# Patient Record
Sex: Male | Born: 1985 | Race: Black or African American | Hispanic: No | Marital: Single | Smoking: Current every day smoker
Health system: Southern US, Community
[De-identification: ages and names within clinical notes are randomized; demographics above are authoritative.]

---
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
## 2007-04-23 ENCOUNTER — Emergency Department (HOSPITAL_COMMUNITY): Admission: EM | Admit: 2007-04-23 | Discharge: 2007-04-23 | Payer: Self-pay | Admitting: Emergency Medicine

## 2007-05-27 ENCOUNTER — Emergency Department (HOSPITAL_COMMUNITY): Admission: EM | Admit: 2007-05-27 | Discharge: 2007-05-27 | Payer: Self-pay | Admitting: Emergency Medicine

## 2007-07-30 ENCOUNTER — Emergency Department (HOSPITAL_COMMUNITY): Admission: EM | Admit: 2007-07-30 | Discharge: 2007-07-30 | Payer: Self-pay | Admitting: Emergency Medicine

## 2007-08-01 ENCOUNTER — Emergency Department (HOSPITAL_COMMUNITY): Admission: EM | Admit: 2007-08-01 | Discharge: 2007-08-01 | Payer: Self-pay | Admitting: Emergency Medicine

## 2007-08-30 ENCOUNTER — Emergency Department (HOSPITAL_COMMUNITY): Admission: EM | Admit: 2007-08-30 | Discharge: 2007-08-30 | Payer: Self-pay | Admitting: Emergency Medicine

## 2007-08-31 ENCOUNTER — Ambulatory Visit (HOSPITAL_COMMUNITY): Admission: RE | Admit: 2007-08-31 | Discharge: 2007-08-31 | Payer: Self-pay | Admitting: Emergency Medicine

## 2008-02-01 ENCOUNTER — Emergency Department (HOSPITAL_BASED_OUTPATIENT_CLINIC_OR_DEPARTMENT_OTHER): Admission: EM | Admit: 2008-02-01 | Discharge: 2008-02-01 | Payer: Self-pay | Admitting: Emergency Medicine

## 2008-05-08 ENCOUNTER — Ambulatory Visit (HOSPITAL_COMMUNITY): Admission: EM | Admit: 2008-05-08 | Discharge: 2008-05-08 | Payer: Self-pay | Admitting: Emergency Medicine

## 2008-09-17 ENCOUNTER — Emergency Department (HOSPITAL_BASED_OUTPATIENT_CLINIC_OR_DEPARTMENT_OTHER): Admission: EM | Admit: 2008-09-17 | Discharge: 2008-09-17 | Payer: Self-pay | Admitting: Emergency Medicine

## 2008-11-05 ENCOUNTER — Emergency Department (HOSPITAL_COMMUNITY): Admission: EM | Admit: 2008-11-05 | Discharge: 2008-11-05 | Payer: Self-pay | Admitting: Emergency Medicine

## 2008-11-11 IMAGING — US US ABDOMEN COMPLETE
1 series · 14 of 25 positions shown · non-contrast
Comparison: None available

CLINICAL DATA: Abdominal pain

ABDOMEN ULTRASOUND
TECHNIQUE: Complete abdominal ultrasound examination was performed
including evaluation of the liver, gallbladder, bile ducts,
pancreas, kidneys, spleen, IVC, and abdominal aorta.

[Series 1: unknown · 0.33mm/px · 14 of 57 slices shown]
[im 1/57]
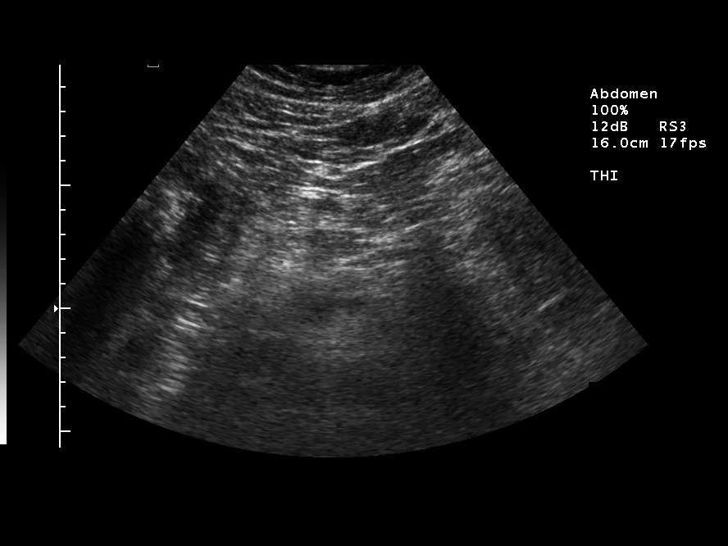
[im 5/57]
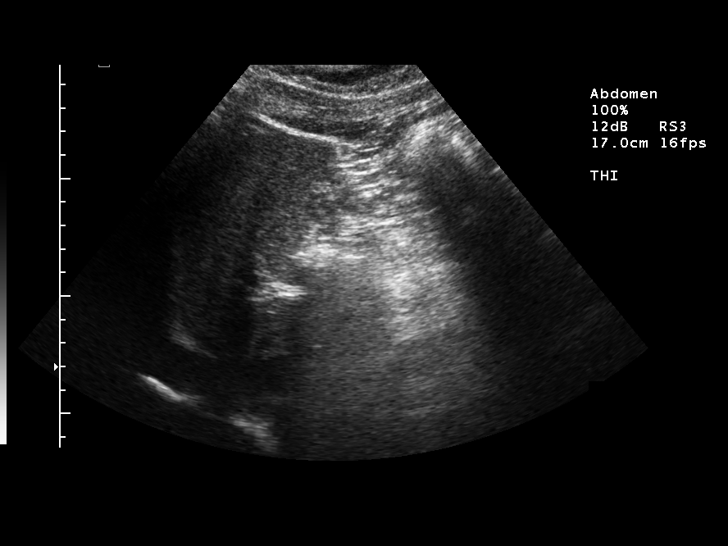
[im 10/57]
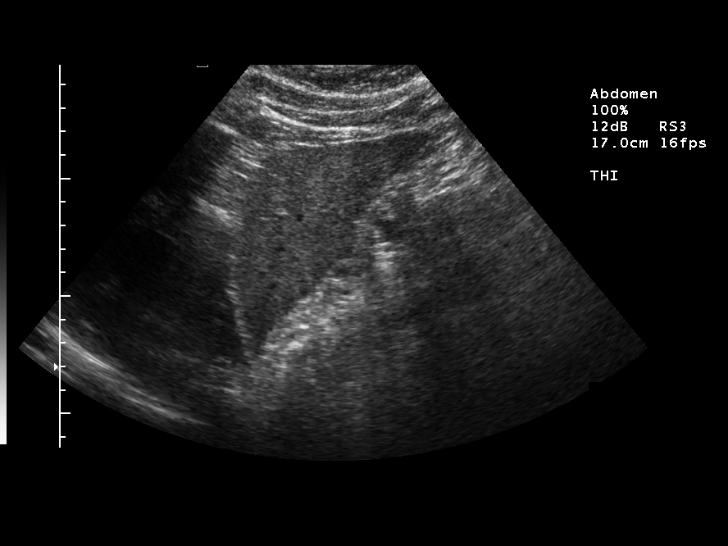
[im 15/57]
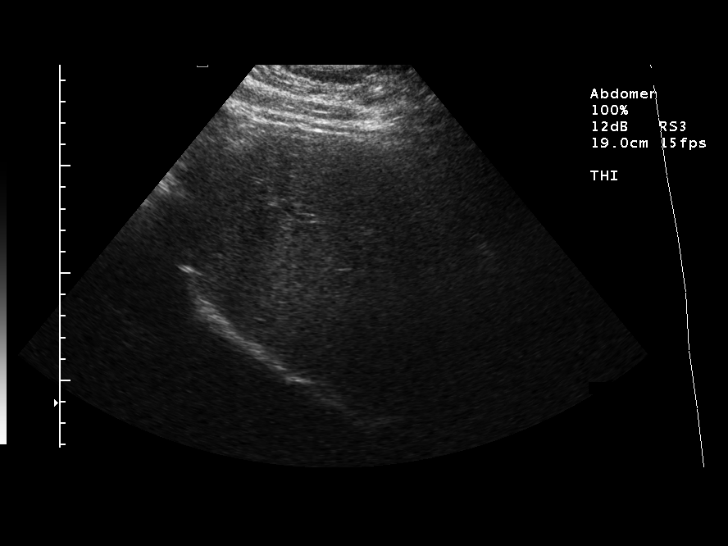
[im 19/57]
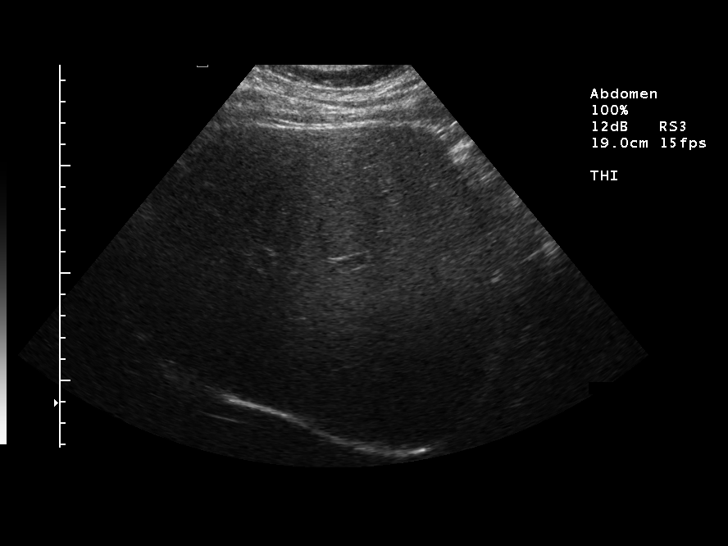
[im 22/57]
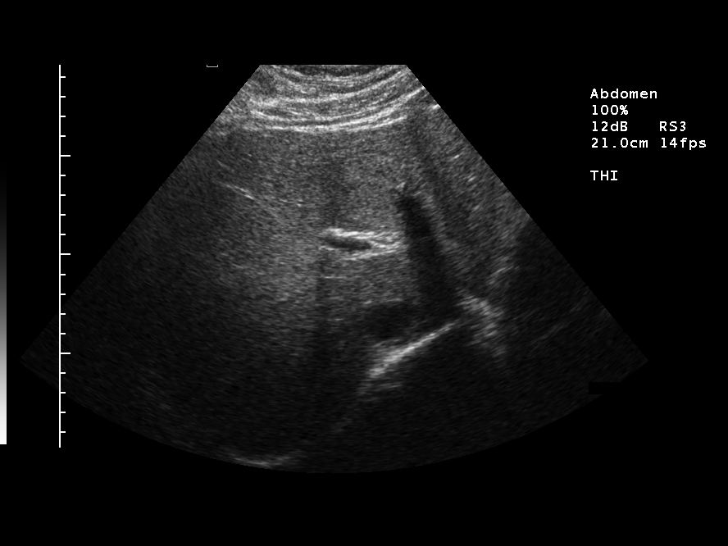
[im 26/57]
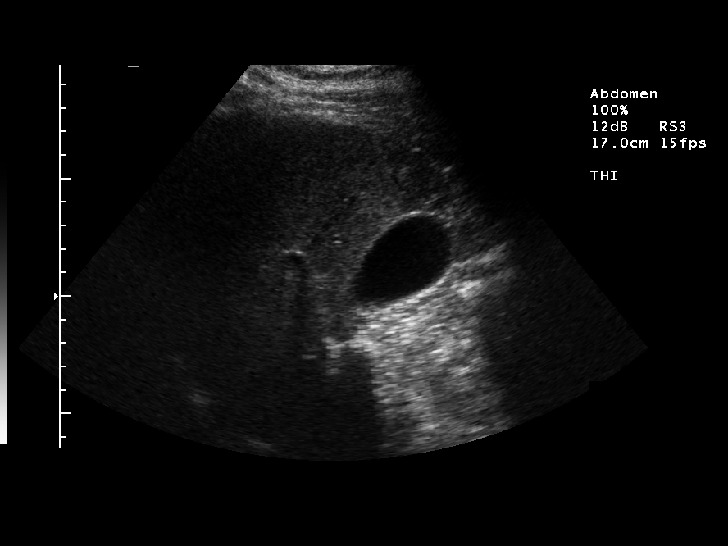
[im 31/57]
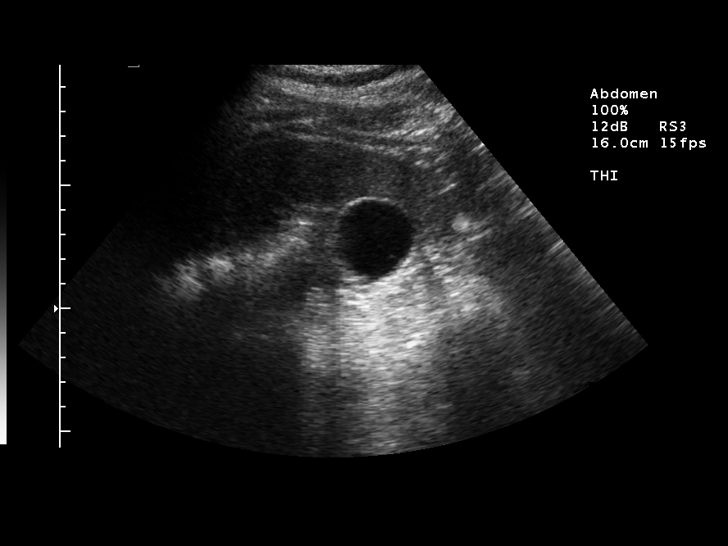
[im 36/57]
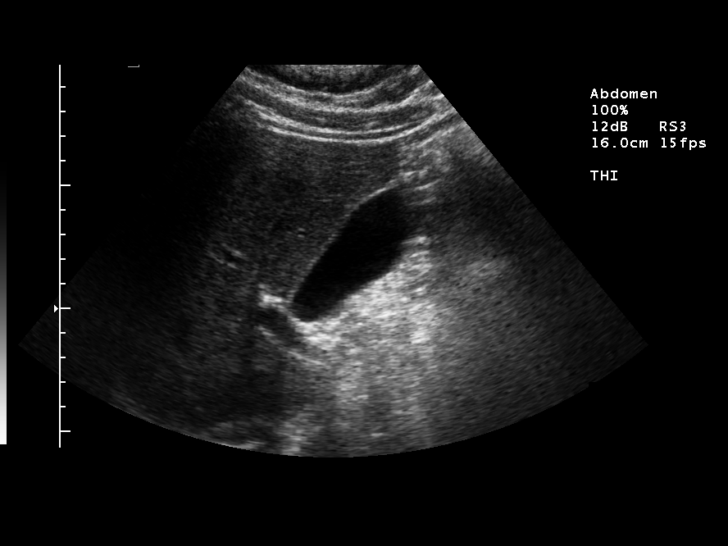
[im 38/57]
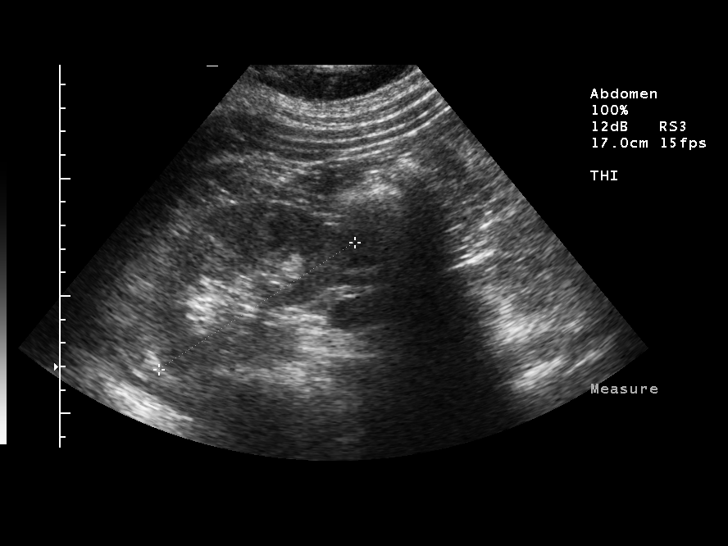
[im 43/57]
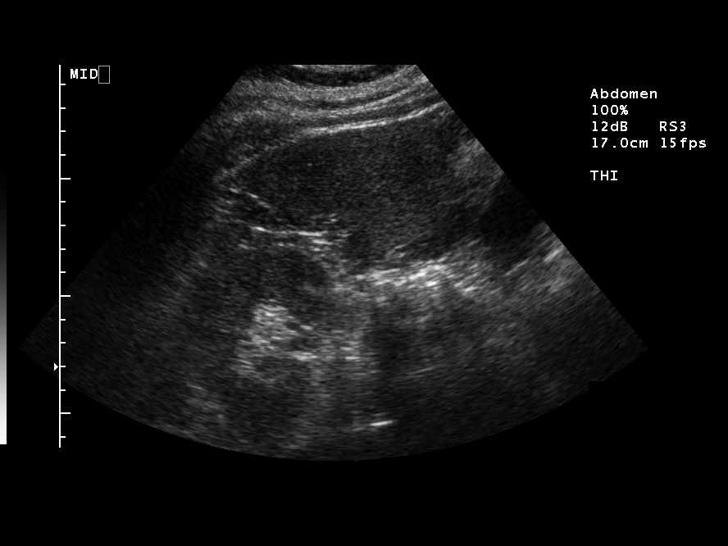
[im 47/57]
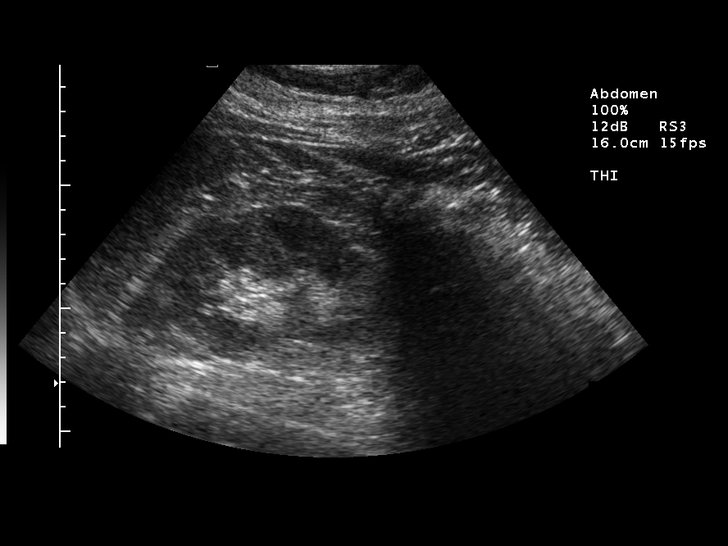
[im 52/57]
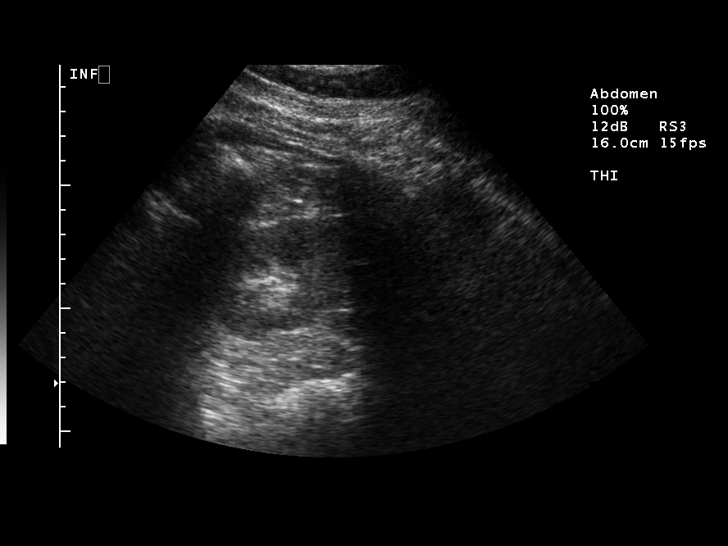
[im 57/57]
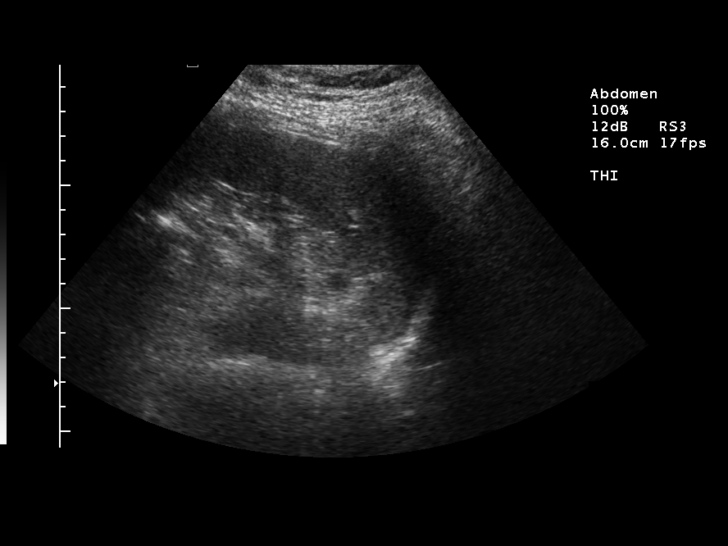

[14 of 25 positions shown; findings below may reference images not displayed]

FINDINGS: Gallbladder:  No gallstones.  No gallbladder wall thickening or
pericholecystic fluid. Negative sonographic Murphy's sign per the
ultrasound technologist.

Common bile duct: Normal in caliber.

Liver:  Normal size and echotexture.  No focal parenchymal
abnormalities.

Inferior vena cava:  Patent.

Pancreas:  Visualized portions unremarkable.

Spleen:  Normal size and echotexture without focal parenchymal
abnormalities.

Right kidney:  No hydronephrosis.   Normal parenchymal echotexture
without focal abnormalities.

Left kidney:  No hydronephrosis. Normal parenchymal echotexture
without focal abnormalities.

Abdominal aorta:  Visualized portions normal in caliber,
unremarkable..
IMPRESSION: Normal abdominal ultrasound.

## 2009-02-13 ENCOUNTER — Emergency Department (HOSPITAL_COMMUNITY): Admission: EM | Admit: 2009-02-13 | Discharge: 2009-02-13 | Payer: Self-pay | Admitting: Emergency Medicine

## 2009-04-17 ENCOUNTER — Emergency Department: Payer: Self-pay | Admitting: Emergency Medicine

## 2009-04-29 ENCOUNTER — Emergency Department: Payer: Self-pay | Admitting: Unknown Physician Specialty

## 2010-04-03 LAB — CBC
MCHC: 34.1 g/dL (ref 30.0–36.0)
MCV: 93 fL (ref 78.0–100.0)
RDW: 14.4 % (ref 11.5–15.5)
WBC: 9.3 10*3/uL (ref 4.0–10.5)

## 2010-04-03 LAB — DIFFERENTIAL
Basophils Absolute: 0 10*3/uL (ref 0.0–0.1)
Basophils Relative: 0 % (ref 0–1)
Eosinophils Absolute: 0.2 10*3/uL (ref 0.0–0.7)
Lymphocytes Relative: 28 % (ref 12–46)

## 2010-04-03 LAB — BASIC METABOLIC PANEL
Calcium: 9.3 mg/dL (ref 8.4–10.5)
Chloride: 104 mEq/L (ref 96–112)
Creatinine, Ser: 0.87 mg/dL (ref 0.4–1.5)
GFR calc non Af Amer: >60 mL/min (ref >60)
Potassium: 5.7 mEq/L — ABNORMAL HIGH (ref 3.5–5.1)
Sodium: 135 mEq/L (ref 135–145)

## 2010-04-03 LAB — PROTIME-INR
INR: 0.92 (ref 0.00–1.49)
Prothrombin Time: 12.3 seconds (ref 11.6–15.2)

## 2010-04-03 LAB — RAPID URINE DRUG SCREEN, HOSP PERFORMED
Amphetamines: NOT DETECTED
Barbiturates: NOT DETECTED
Benzodiazepines: NOT DETECTED
Tetrahydrocannabinol: NOT DETECTED

## 2010-04-03 LAB — APTT: aPTT: 33 seconds (ref 24–37)

## 2010-04-03 LAB — CSF CELL COUNT WITH DIFFERENTIAL
RBC Count, CSF: 1670 /mm3 — ABNORMAL HIGH
Tube #: 1
Tube #: 4
WBC, CSF: 0 /mm3 (ref 0–5)

## 2010-04-03 LAB — GRAM STAIN: Gram Stain: NONE SEEN

## 2010-04-03 LAB — POTASSIUM: Potassium: 4.2 mEq/L (ref 3.5–5.1)

## 2010-04-03 LAB — PROTEIN, CSF: Total  Protein, CSF: 43 mg/dL (ref 15–45)

## 2010-04-24 LAB — DIFFERENTIAL
Basophils Absolute: 0.1 10*3/uL (ref 0.0–0.1)
Basophils Relative: 1 % (ref 0–1)
Lymphocytes Relative: 29 % (ref 12–46)
Monocytes Relative: 5 % (ref 3–12)
Neutro Abs: 5.1 10*3/uL (ref 1.7–7.7)
Neutrophils Relative %: 63 % (ref 43–77)

## 2010-04-24 LAB — GRAM STAIN

## 2010-04-24 LAB — ANAEROBIC CULTURE

## 2010-04-24 LAB — CULTURE, ROUTINE-ABSCESS

## 2010-04-24 LAB — CBC
Hemoglobin: 13.9 g/dL (ref 13.0–17.0)
MCHC: 33.3 g/dL (ref 30.0–36.0)
RBC: 4.46 MIL/uL (ref 4.22–5.81)
WBC: 8.1 10*3/uL (ref 4.0–10.5)

## 2010-04-27 IMAGING — RF DG FLUORO GUIDE NDL PLC/BX
2 series · 2 of 2 positions shown · non-contrast
Comparison: none

CLINICAL DATA: Severe headache

FLUOROSCOPIC GUIDED LUMBAR PUNCTURE:
TECHNIQUE: Written informed consent for fluoroscopic-guided lumbar puncture
was obtained.
Patient was placed prone.
L4-L5 disc space was localized under fluoroscopy.
Skin prepped and draped in usual sterile fashion.
Skin and soft tissues anesthetized with 1.5 ml of 1% lidocaine.
20 gauge spinal needle advanced into spinal canal.
Clear colorless CSF was encountered with opening pressure of 21 cm
H2O.
8 ml of CSF was obtained in four tubes for requested laboratory
analysis.
Procedure tolerated well by patient without immediate complication.
Routine post procedure orders placed on the chart.
Fluoroscopy time: 0.3 minutes

[Series 1: run · 1 of 1 slices shown (1 of 2)]
[im 1/1]
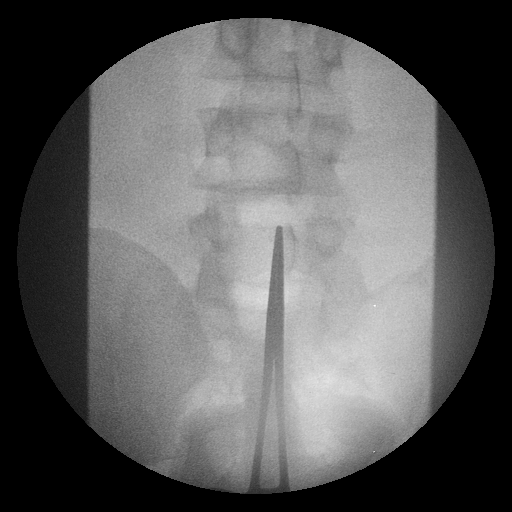

[Series 2: run · 1 of 1 slices shown (2 of 2)]
[im 1/1]
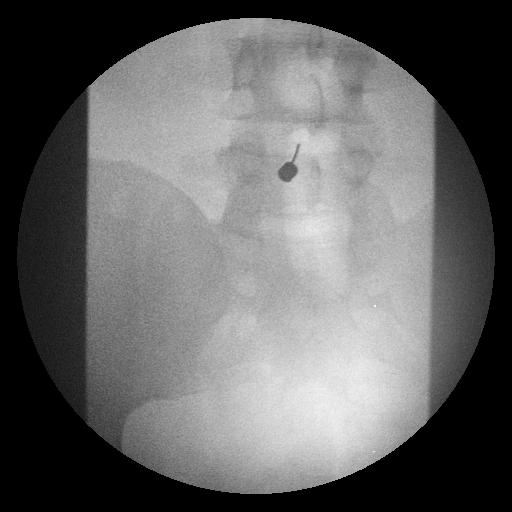

[2 of 2 positions shown; findings below may reference images not displayed]

IMPRESSION: Fluoroscopic guided lumbar puncture as above.

## 2010-04-27 IMAGING — CT CT HEAD W/O CM
1 series · 16 of 30 positions shown, 20 images · non-contrast
Comparison: None

CLINICAL DATA: Occipital headache, migraine

CT HEAD WITHOUT CONTRAST
TECHNIQUE: Contiguous axial images were obtained from the base of
the skull through the vertex without contrast.

[Series 2: headseq 4.8 h45s · axial · 0.43mm/px · z∈[-673,-521]mm · 16 of 36 slices shown, 20 images]
[im 2/36  brain]
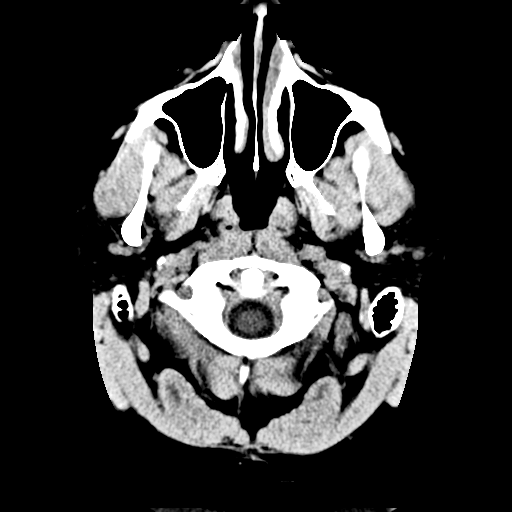
[im 2/36  bone]
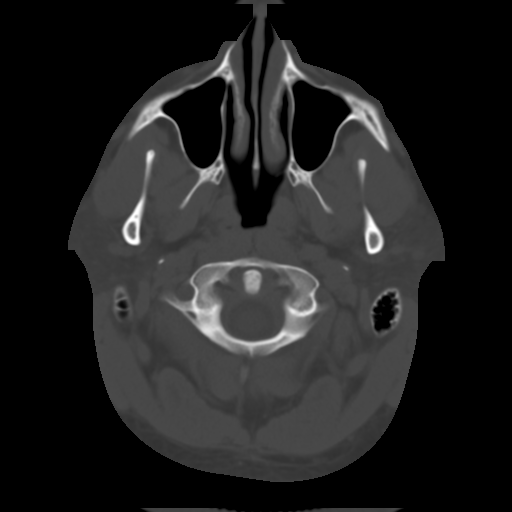
[im 4/36  brain]
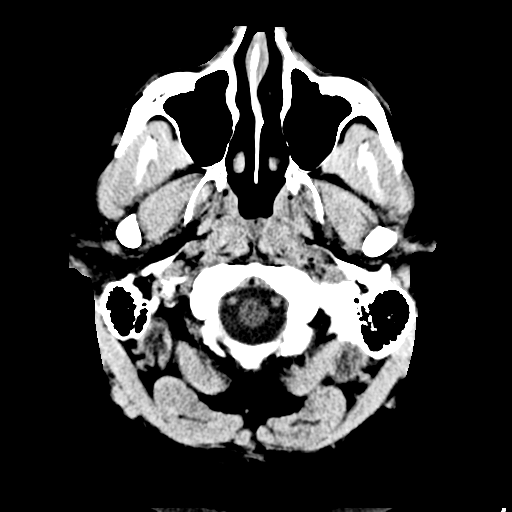
[im 7/36  brain]
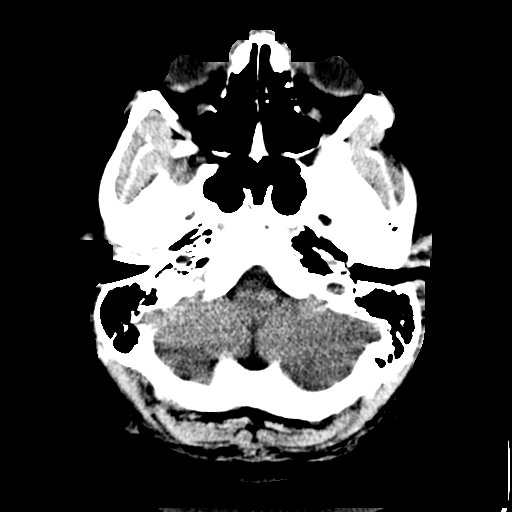
[im 9/36  brain]
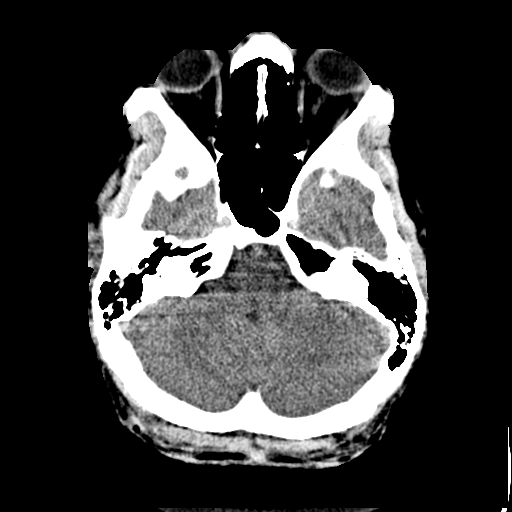
[im 10/36  brain]
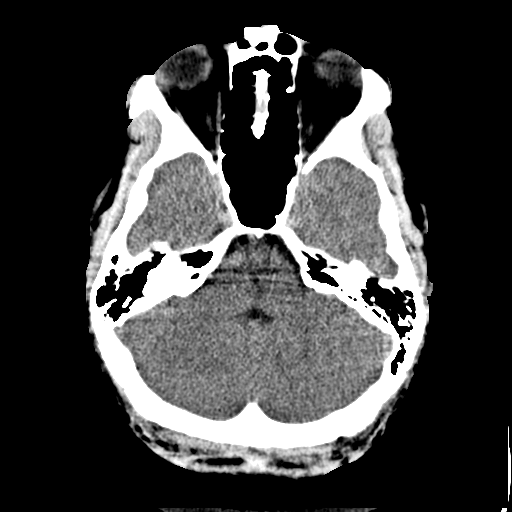
[im 10/36  bone]
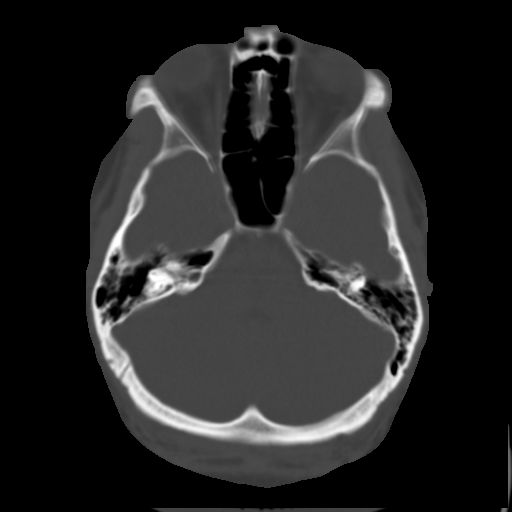
[im 13/36  brain]
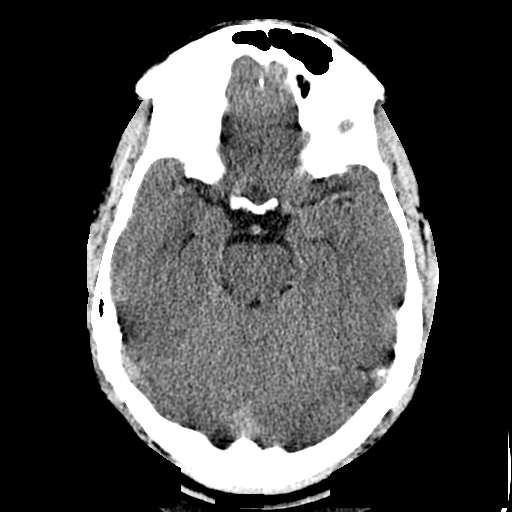
[im 15/36  brain]
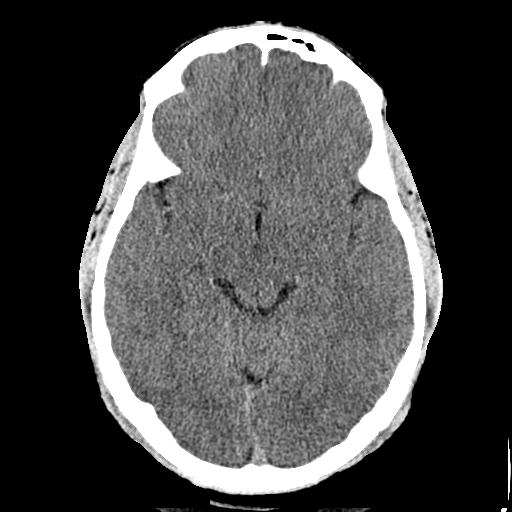
[im 17/36  brain]
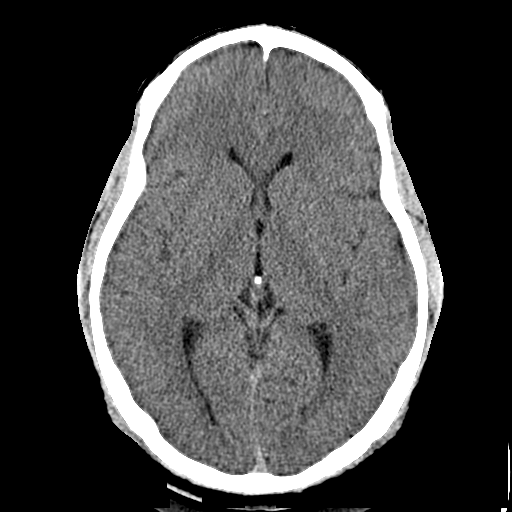
[im 19/36  brain]
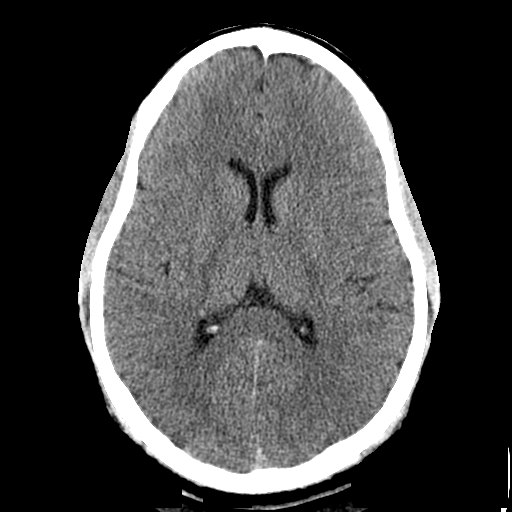
[im 19/36  bone]
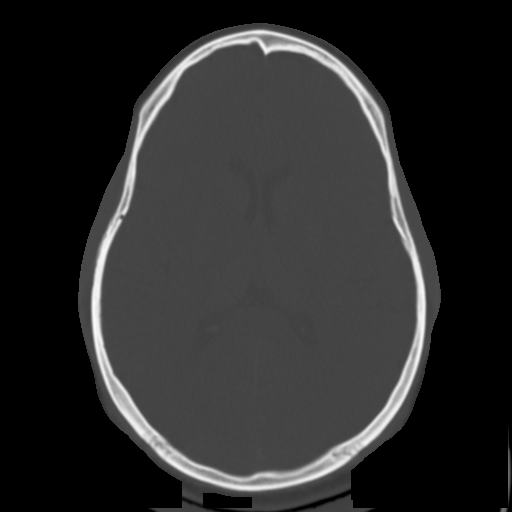
[im 21/36  brain]
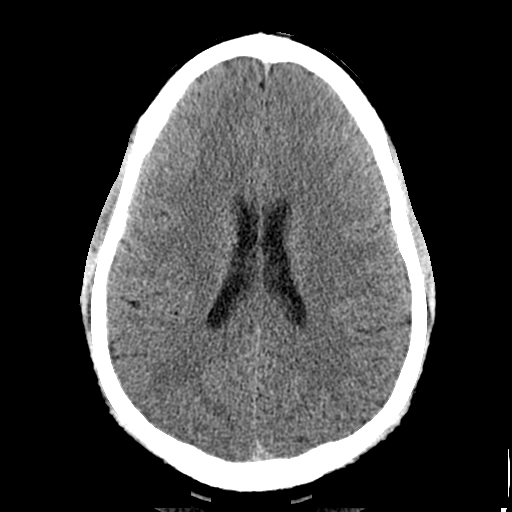
[im 23/36  brain]
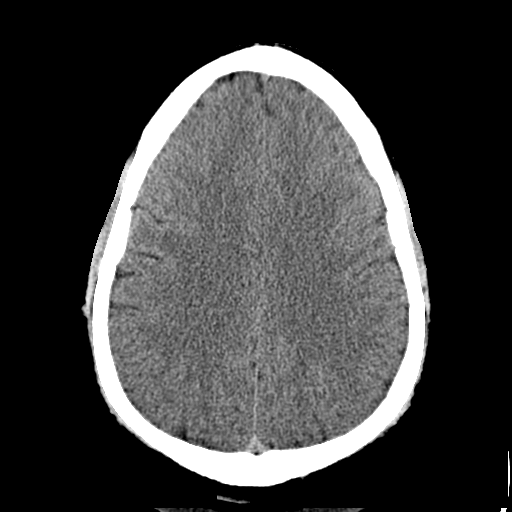
[im 26/36  brain]
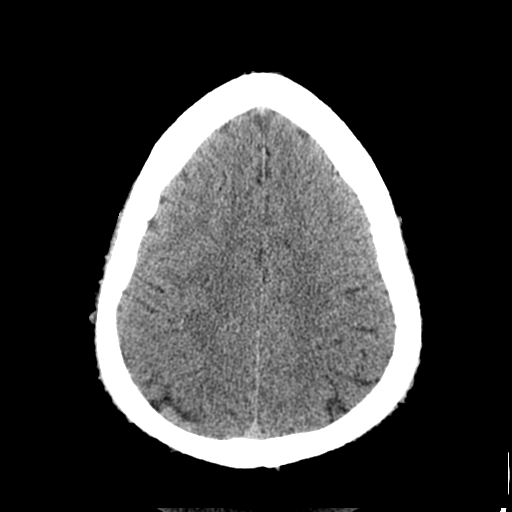
[im 27/36  brain]
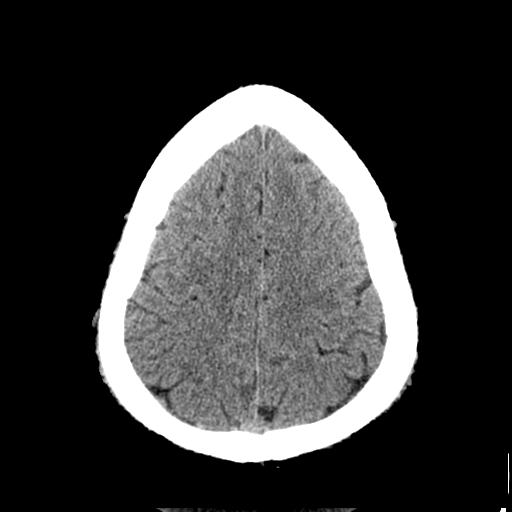
[im 27/36  bone]
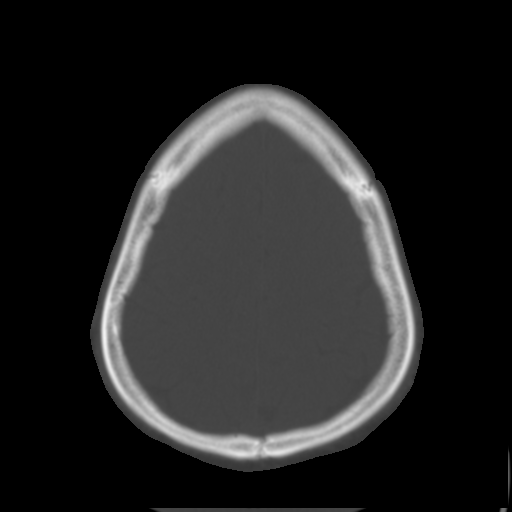
[im 29/36  brain]
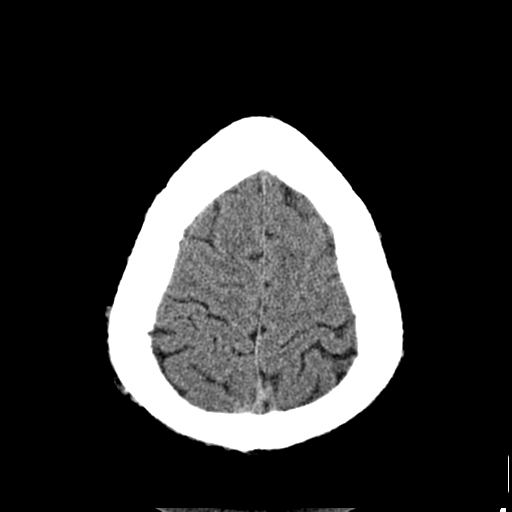
[im 32/36  brain]
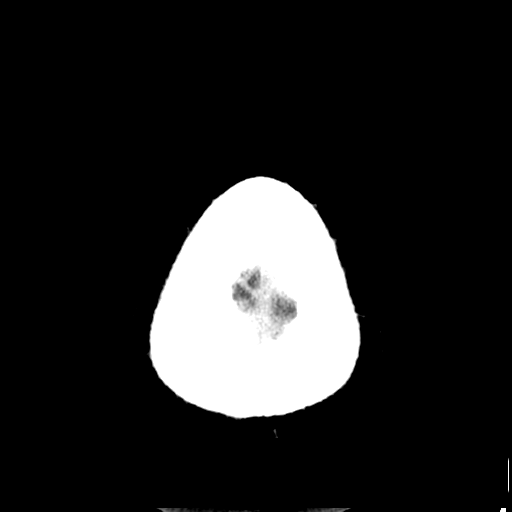
[im 34/36  brain]
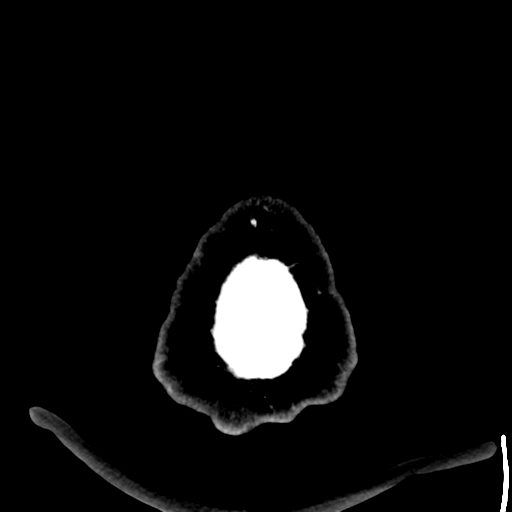

[16 of 30 positions shown; findings below may reference images not displayed]

FINDINGS: Normal ventricular morphology.
No midline shift or mass effect.
Normal appearance of brain parenchyma.
No intracranial hemorrhage, mass lesion, or acute infarction.
Visualized paranasal sinuses and mastoid air cells clear.
Bones unremarkable.
IMPRESSION: No acute intracranial abnormalities.

## 2010-04-29 LAB — URINALYSIS, ROUTINE W REFLEX MICROSCOPIC
Ketones, ur: NEGATIVE mg/dL
Nitrite: NEGATIVE
Protein, ur: NEGATIVE mg/dL
Specific Gravity, Urine: 1.025 (ref 1.005–1.030)
Urobilinogen, UA: 1 mg/dL (ref 0.0–1.0)
pH: 6.5 (ref 5.0–8.0)

## 2010-04-29 LAB — DIFFERENTIAL
Basophils Absolute: 0.2 10*3/uL — ABNORMAL HIGH (ref 0.0–0.1)
Lymphocytes Relative: 28 % (ref 12–46)
Lymphs Abs: 3 10*3/uL (ref 0.7–4.0)
Monocytes Absolute: 0.6 10*3/uL (ref 0.1–1.0)
Monocytes Relative: 6 % (ref 3–12)
Neutro Abs: 6.4 10*3/uL (ref 1.7–7.7)

## 2010-04-29 LAB — BASIC METABOLIC PANEL
Calcium: 9.5 mg/dL (ref 8.4–10.5)
GFR calc Af Amer: >60 mL/min (ref >60)
GFR calc non Af Amer: >60 mL/min (ref >60)
Potassium: 3.8 mEq/L (ref 3.5–5.1)
Sodium: 142 mEq/L (ref 135–145)

## 2010-04-29 LAB — CBC
Hemoglobin: 14.1 g/dL (ref 13.0–17.0)
RBC: 4.47 MIL/uL (ref 4.22–5.81)

## 2010-04-29 LAB — URINE MICROSCOPIC-ADD ON

## 2010-05-28 NOTE — Consult Note (Signed)
NAMEATHEL, MERRIWEATHER             ACCOUNT NO.:  [id number]   MEDICAL RECORD NO.:  [id number]          PATIENT TYPE:  OBV   LOCATION:  2550                         FACILITY:  [facility]   PHYSICIAN:  Artist Pais. Weingold, M.D.DATE OF BIRTH:  [birth year]   DATE OF CONSULTATION:  05/08/2008  DATE OF DISCHARGE:                                 CONSULTATION   PHYSICIAN REQUESTING CONSULTATION:  Donnetta Hutching, MD   REASON FOR CONSULTATION:  Fox Salminen is a 25 year old right-hand  dominant male who accidentally stabbed himself on the dorsal aspect of  his nondominant left hand yesterday and webspace between the index and  long finger, presents today with pain, swelling, subjective complaints  of fever and chills as well as an extending swollen area on the dorsal  aspect of his hand with limited range of motion.  He is otherwise  healthy 25 year old who is right-handed dominant, works as a Financial risk analyst.   He has no known drug allergies.   No current medications.   No recent hospitalization or surgeries.   FAMILY MEDICAL HISTORY:  Noncontributory.   SOCIAL HISTORY:  Noncontributory.   PHYSICAL EXAMINATION:  GENERAL:  A well-nourished male, pleasant, alert  and oriented x3.  EXTREMITIES:  Examination of his hand, on the left, he has a 2-cm stab  wound in the webspace between the index and long finger.  It is swollen  and tender.  He has limited ability to make a fist or extend, although  his tendon function appears to be intact, but painful.  It is red,  swollen, and tender.  There is mild erythema.  There is some drainage,  serous type, no purulence is noted.  He has 2+ radial  pulse.  Brisk  capillary refill.  Rest of his right and left upper extremity exam is  normal by comparison.   X-ray showed no foreign material, soft tissue shadowing only.   IMPRESSION:  A 25 year old male with a deep stab wound on the dorsal  aspect of his nondominant left hand with pain with flexion and extension  and  probable deep abscess formation.  We had a thorough and frank  discussion with him regarding treatment options.  At this point in time,  we will recommend incision and drainage as I suspect he is developing a  deep abscess possibly into the thenar compartment as he is tender there  as well.  He will be taken to the operating room for an incision and  drainage emergently.     Artist Pais Mina Marble, M.D.  Electronically Signed    MAW/MEDQ  D:  05/08/2008  T:  05/09/2008  Job:  [id number]

## 2010-05-28 NOTE — Op Note (Signed)
[name], Jesse Dixon             ACCOUNT NO.:  [id number]   MEDICAL RECORD NO.:  [id number]          PATIENT TYPE:  OBV   LOCATION:  2550                         FACILITY:  [facility]   PHYSICIAN:  Artist Pais. Weingold, M.D.DATE OF BIRTH:  [birth year]   DATE OF PROCEDURE:  05/08/2008  DATE OF DISCHARGE:                               OPERATIVE REPORT   PREOPERATIVE DIAGNOSIS:  Deep abscess, dorsal aspect of left hand.   POSTOPERATIVE DIAGNOSIS:  Deep abscess, dorsal aspect of left hand.   PROCEDURE:  Incision and drainage of above with open packing with  quarter-inch iodoform gauze with cultures.   SURGEON:  Artist Pais. Mina Marble, MD   ASSISTANT:  None.   ANESTHESIA:  General.   TOURNIQUET TIME:  21 minutes.   COMPLICATIONS:  No complications.   DRAINS:  No drains.   OPERATIVE REPORT:  The patient was taken to the operating suite.  After  induction of adequate general anesthesia, the left upper extremity was  prepped and draped in sterile fashion.  An Esmarch was used to  exsanguinate the limb.  The tourniquet was inflated to 250 mmHg.  At  this point in time, an oblique stab wound over the dorsal aspect of the  left hand to the index and long metacarpals was extended proximally and  distally 2 cm.  The skin was incised sharply.  Dissection was carried  down to the interval between the index and long metacarpophalangeal  joints.  There was a large hematoma formation and some purulence was  encountered.  This was cultured.  Dissection was carried down into the  web space down to the volar and metacarpal ligament area.  There was no  evidence of neurovascular damage, and the neurovascular bundle was  identified and seemed to be intact.  The extensor mechanism was intact.  Flexor sheath appeared to be intact.  The wound was thoroughly irrigated  and was debrided of any clot.  It was then loosely closed over a quarter-  inch iodoform gauze which was packed proximally and distally and  loosely  closed with 4-0 nylon.  Xeroform, 4x4 fluffs, and a compressive bandage  was applied.  The patient tolerated the procedure well, went to recovery  room in stable fashion.      Artist Pais Mina Marble, M.D.  Electronically Signed     MAW/MEDQ  D:  05/08/2008  T:  05/09/2008  Job:  [id number]

## 2010-10-11 LAB — DIFFERENTIAL
Basophils Absolute: 0
Basophils Relative: 1
Basophils Relative: 0
Eosinophils Absolute: 0.1
Monocytes Absolute: 0.7
Monocytes Relative: 5
Neutro Abs: 8.5 — ABNORMAL HIGH
Neutrophils Relative %: 92 — ABNORMAL HIGH

## 2010-10-11 LAB — CBC
Hemoglobin: 14.7
MCHC: 33.6
MCHC: 33.7
MCV: 92.1
Platelets: 150
RBC: 4.75
WBC: 15.2 — ABNORMAL HIGH

## 2010-10-11 LAB — POCT CARDIAC MARKERS
CKMB, poc: <1 — ABNORMAL LOW
Myoglobin, poc: 96.1
Operator id: 295131
Troponin i, poc: <0.05

## 2010-10-11 LAB — BASIC METABOLIC PANEL
CO2: 23
Calcium: 9.3
Chloride: 106
GFR calc Af Amer: >60
Potassium: 3.7
Sodium: 137

## 2010-10-11 LAB — POCT I-STAT, CHEM 8
HCT: 51
Hemoglobin: 17.3 — ABNORMAL HIGH
Potassium: 3.8
Sodium: 139

## 2018-06-07 ENCOUNTER — Encounter (HOSPITAL_BASED_OUTPATIENT_CLINIC_OR_DEPARTMENT_OTHER): Payer: Self-pay | Admitting: Emergency Medicine

## 2018-06-07 ENCOUNTER — Other Ambulatory Visit: Payer: Self-pay

## 2018-06-07 ENCOUNTER — Emergency Department (HOSPITAL_BASED_OUTPATIENT_CLINIC_OR_DEPARTMENT_OTHER)
Admission: EM | Admit: 2018-06-07 | Discharge: 2018-06-07 | Disposition: A | Discharge status: Home / Self Care | Payer: 59 | Attending: Emergency Medicine | Admitting: Emergency Medicine

## 2018-06-07 DIAGNOSIS — Z79899 Other long term (current) drug therapy: Secondary | ICD-10-CM | POA: Insufficient documentation

## 2018-06-07 DIAGNOSIS — R51 Headache: Secondary | ICD-10-CM | POA: Insufficient documentation

## 2018-06-07 DIAGNOSIS — K0889 Other specified disorders of teeth and supporting structures: Secondary | ICD-10-CM | POA: Diagnosis present

## 2018-06-07 MED ORDER — KETOROLAC TROMETHAMINE 30 MG/ML IJ SOLN
30.0000 mg | Freq: Once | INTRAMUSCULAR | Status: AC
  Administered 2018-06-07: 01:00:00 30 mg via INTRAMUSCULAR
  Filled 2018-06-07: qty 1

## 2018-06-07 MED ORDER — HYDROCODONE-ACETAMINOPHEN 5-325 MG PO TABS: 1.0000 | ORAL_TABLET | Freq: Once | ORAL | Status: DC

## 2018-06-07 MED ORDER — PENICILLIN V POTASSIUM 500 MG PO TABS: 500.0000 mg | ORAL_TABLET | Freq: Four times a day (QID) | ORAL | 0 refills | Status: AC

## 2018-06-07 MED ORDER — NAPROXEN 500 MG PO TABS: 500.0000 mg | ORAL_TABLET | Freq: Two times a day (BID) | ORAL | 0 refills | Status: DC

## 2018-06-07 NOTE — Discharge Instructions (Addendum)
Make sure to take antibiotics as prescribed.  Follow-up closely with oral surgery as scheduled.

## 2018-06-07 NOTE — ED Triage Notes (Signed)
L upper dental pain with headache x1 week. Reports goody powders not effective. Tried orageltonight and reports some improvement.

## 2018-06-07 NOTE — ED Provider Notes (Signed)
[facility] EMERGENCY DEPARTMENT Provider Note   CSN: [id number] Arrival date & time: 06/07/18  0036    History   Chief Complaint Chief Complaint  Patient presents with  . Dental Pain  . Headache    HPI Jesse Dixon is a 33 y.o. male.     HPI  This is a 33 year old male with no reported past medical history who presents with dental pain.  Patient reports several day history of left upper molar pain.  He reports associated headaches.  Pain is somewhat relieved with Goody powder and Orajel.  He describes the pain as throbbing and nonradiating.  10 out of 10 prior to arrival.  Now 4 out of 10 after applying Orajel.  Has not noted any difficulty swallowing.  Denies any fevers, strokelike symptoms.  He does report that he contacted an oral surgeon who can see him on Thursday.  History reviewed. No pertinent past medical history.  There are no active problems to display for this patient.   History reviewed. No pertinent surgical history.      Home Medications    Prior to Admission medications   Medication Sig Start Date End Date Taking? Authorizing Provider  Aspirin-Caffeine 845-65 MG PACK Take by mouth.   Yes [provider]  naproxen (NAPROSYN) 500 MG tablet Take 1 tablet (500 mg total) by mouth 2 (two) times daily. 06/07/18   Horton, Mayer Maskerourtney F, MD  penicillin v potassium (VEETID) 500 MG tablet Take 1 tablet (500 mg total) by mouth 4 (four) times daily for 10 days. 06/07/18 06/17/18  Shon BatonHorton, Courtney F, MD    Family History No family history on file.  Social History Social History   Tobacco Use  . Smoking status: Current Every Day Smoker    Packs/day: 0.50    Types: Cigarettes  . Smokeless tobacco: Never Used  Substance Use Topics  . Alcohol use: Never    Frequency: Never  . Drug use: Never     Allergies   Patient has no known allergies.   Review of Systems Review of Systems  Constitutional: Negative for fever.  HENT: Positive for  dental problem.   Respiratory: Negative for shortness of breath.   Cardiovascular: Negative for chest pain.  Gastrointestinal: Positive for nausea. Negative for vomiting.  Neurological: Positive for headaches.  All other systems reviewed and are negative.    Physical Exam Updated Vital Signs BP (!) 138/100 (BP Location: Right Arm)   Pulse 89   Temp 97.9 F (36.6 C) (Oral)   Resp 18   Ht 1.753 m (5\' 9" )   Wt 116.1 kg   SpO2 96%   BMI 37.80 kg/m   Physical Exam Vitals signs and nursing note reviewed.  Constitutional:      Appearance: He is well-developed. He is not ill-appearing.  HENT:     Head: Normocephalic and atraumatic.     Comments: No facial swelling noted    Mouth/Throat:     Mouth: Mucous membranes are moist.     Dentition: Dental caries present. No gingival swelling or dental abscesses.     Tongue: No lesions.   Neck:     Musculoskeletal: Neck supple.  Cardiovascular:     Rate and Rhythm: Normal rate and regular rhythm.  Pulmonary:     Effort: Pulmonary effort is normal. No respiratory distress.  Abdominal:     Tenderness: There is no rebound.  Lymphadenopathy:     Cervical: No cervical adenopathy.  Skin:    General: Skin  is warm and dry.  Neurological:     Mental Status: He is alert and oriented to person, place, and time.     Comments: Fluent speech, symmetric facial features, normal gait      ED Treatments / Results  Labs (all labs ordered are listed, but only abnormal results are displayed) Labs Reviewed - No data to display  EKG None  Radiology No results found.  Procedures Procedures (including critical care time)  Medications Ordered in ED Medications  ketorolac (TORADOL) 30 MG/ML injection 30 mg (has no administration in time range)  HYDROcodone-acetaminophen (NORCO/VICODIN) 5-325 MG per tablet 1 tablet (1 tablet Oral Not Given 06/07/18 0102)     Initial Impression / Assessment and Plan / ED Course  I have reviewed the triage  vital signs and the nursing notes.  Pertinent labs & imaging results that were available during my care of the patient were reviewed by me and considered in my medical decision making (see chart for details).        Patient presents with dental pain and headache.  He is overall nontoxic-appearing and vital signs are reassuring.  He has pain left upper molar and multiple dental caries.  No facial swelling or dental abscess noted.  No signs or symptoms of Ludwigs.  Patient given IM Toradol.  Will discharge with naproxen and penicillin for probable dental infection.  He has follow-up with an oral surgeon.  He was given strict return precautions.  After history, exam, and medical workup I feel the patient has been appropriately medically screened and is safe for discharge home. Pertinent diagnoses were discussed with the patient. Patient was given return precautions.   Final Clinical Impressions(s) / ED Diagnoses   Final diagnoses:  Pain, dental    ED Discharge Orders         Ordered    naproxen (NAPROSYN) 500 MG tablet  2 times daily     06/07/18 0103    penicillin v potassium (VEETID) 500 MG tablet  4 times daily     06/07/18 0103           Horton, Mayer Masker, MD 06/07/18 [phone]

## 2019-06-18 ENCOUNTER — Encounter (HOSPITAL_BASED_OUTPATIENT_CLINIC_OR_DEPARTMENT_OTHER): Payer: Self-pay | Admitting: Emergency Medicine

## 2019-06-18 ENCOUNTER — Emergency Department (HOSPITAL_BASED_OUTPATIENT_CLINIC_OR_DEPARTMENT_OTHER)
Admission: EM | Admit: 2019-06-18 | Discharge: 2019-06-18 | Disposition: A | Discharge status: Home / Self Care | Payer: 59 | Attending: Emergency Medicine | Admitting: Emergency Medicine

## 2019-06-18 ENCOUNTER — Other Ambulatory Visit: Payer: Self-pay

## 2019-06-18 DIAGNOSIS — R42 Dizziness and giddiness: Secondary | ICD-10-CM | POA: Insufficient documentation

## 2019-06-18 DIAGNOSIS — R509 Fever, unspecified: Secondary | ICD-10-CM | POA: Insufficient documentation

## 2019-06-18 DIAGNOSIS — R197 Diarrhea, unspecified: Secondary | ICD-10-CM

## 2019-06-18 DIAGNOSIS — Z7982 Long term (current) use of aspirin: Secondary | ICD-10-CM | POA: Insufficient documentation

## 2019-06-18 DIAGNOSIS — R112 Nausea with vomiting, unspecified: Secondary | ICD-10-CM | POA: Insufficient documentation

## 2019-06-18 DIAGNOSIS — F17210 Nicotine dependence, cigarettes, uncomplicated: Secondary | ICD-10-CM | POA: Insufficient documentation

## 2019-06-18 DIAGNOSIS — T50Z95A Adverse effect of other vaccines and biological substances, initial encounter: Secondary | ICD-10-CM | POA: Insufficient documentation

## 2019-06-18 DIAGNOSIS — R1084 Generalized abdominal pain: Secondary | ICD-10-CM | POA: Insufficient documentation

## 2019-06-18 DIAGNOSIS — Z79899 Other long term (current) drug therapy: Secondary | ICD-10-CM | POA: Insufficient documentation

## 2019-06-18 LAB — COMPREHENSIVE METABOLIC PANEL
ALT: 24 U/L (ref 0–44)
AST: 20 U/L (ref 15–41)
Albumin: 4.4 g/dL (ref 3.5–5.0)
Alkaline Phosphatase: 51 U/L (ref 38–126)
Anion gap: 10 (ref 5–15)
BUN: 12 mg/dL (ref 6–20)
CO2: 25 mmol/L (ref 22–32)
Calcium: 9 mg/dL (ref 8.9–10.3)
Chloride: 100 mmol/L (ref 98–111)
Creatinine, Ser: 1.03 mg/dL (ref 0.61–1.24)
GFR calc Af Amer: >60 mL/min (ref >60)
GFR calc non Af Amer: >60 mL/min (ref >60)
Glucose, Bld: 97 mg/dL (ref 70–99)
Potassium: 4 mmol/L (ref 3.5–5.1)
Sodium: 135 mmol/L (ref 135–145)
Total Bilirubin: 0.9 mg/dL (ref 0.3–1.2)
Total Protein: 7.4 g/dL (ref 6.5–8.1)

## 2019-06-18 LAB — URINALYSIS, ROUTINE W REFLEX MICROSCOPIC
Bilirubin Urine: NEGATIVE
Glucose, UA: NEGATIVE mg/dL
Ketones, ur: NEGATIVE mg/dL
Leukocytes,Ua: NEGATIVE
Nitrite: NEGATIVE
Protein, ur: NEGATIVE mg/dL
Specific Gravity, Urine: 1.02 (ref 1.005–1.030)
pH: 6 (ref 5.0–8.0)

## 2019-06-18 LAB — LIPASE, BLOOD: Lipase: 21 U/L (ref 11–51)

## 2019-06-18 LAB — URINALYSIS, MICROSCOPIC (REFLEX)

## 2019-06-18 LAB — CBC
HCT: 44.2 % (ref 39.0–52.0)
Hemoglobin: 14.6 g/dL (ref 13.0–17.0)
MCH: 30.8 pg (ref 26.0–34.0)
MCHC: 33 g/dL (ref 30.0–36.0)
MCV: 93.2 fL (ref 80.0–100.0)
Platelets: 145 10*3/uL — ABNORMAL LOW (ref 150–400)
RBC: 4.74 MIL/uL (ref 4.22–5.81)
RDW: 14 % (ref 11.5–15.5)
WBC: 8.9 10*3/uL (ref 4.0–10.5)
nRBC: 0 % (ref 0.0–0.2)

## 2019-06-18 MED ORDER — SODIUM CHLORIDE 0.9 % IV BOLUS
1000.0000 mL | Freq: Once | INTRAVENOUS | Status: AC
  Administered 2019-06-18: 1000 mL via INTRAVENOUS

## 2019-06-18 MED ORDER — ONDANSETRON 4 MG PO TBDP
4.0000 mg | ORAL_TABLET | Freq: Once | ORAL | Status: AC | PRN
  Administered 2019-06-18: 4 mg via ORAL
  Filled 2019-06-18: qty 1

## 2019-06-18 MED ORDER — ACETAMINOPHEN 325 MG PO TABS
650.0000 mg | ORAL_TABLET | Freq: Once | ORAL | Status: AC
  Administered 2019-06-18: 650 mg via ORAL
  Filled 2019-06-18: qty 2

## 2019-06-18 MED ORDER — FENTANYL CITRATE (PF) 100 MCG/2ML IJ SOLN
50.0000 ug | Freq: Once | INTRAMUSCULAR | Status: DC
  Filled 2019-06-18: qty 2

## 2019-06-18 MED ORDER — ONDANSETRON 4 MG PO TBDP
4.0000 mg | ORAL_TABLET | Freq: Three times a day (TID) | ORAL | 0 refills | Status: DC | PRN
Start: 2019-06-18 — End: 2020-10-27

## 2019-06-18 MED ORDER — DICYCLOMINE HCL 20 MG PO TABS
20.0000 mg | ORAL_TABLET | Freq: Two times a day (BID) | ORAL | 0 refills | Status: DC
Start: 2019-06-18 — End: 2020-10-27

## 2019-06-18 MED ORDER — ONDANSETRON HCL 4 MG/2ML IJ SOLN
4.0000 mg | Freq: Once | INTRAMUSCULAR | Status: AC
  Administered 2019-06-18: 4 mg via INTRAVENOUS
  Filled 2019-06-18: qty 2

## 2019-06-18 MED ORDER — KETOROLAC TROMETHAMINE 30 MG/ML IJ SOLN
30.0000 mg | Freq: Once | INTRAMUSCULAR | Status: AC
  Administered 2019-06-18: 30 mg via INTRAVENOUS
  Filled 2019-06-18: qty 1

## 2019-06-18 NOTE — ED Provider Notes (Signed)
[facility] EMERGENCY DEPARTMENT Provider Note   CSN: [id number] Arrival date & time: 06/18/19  1223     History Chief Complaint  Patient presents with   Abdominal Pain   Fever    Jesse Dixon is a 34 y.o. male who presents for evaluation of lightheadedness, abdominal pain, fever, nausea that began last night.  Patient reports that yesterday, he got his second dose of his been during a Covid vaccine.  He reports that last night at home, he started feeling "not like himself."  He started feeling lightheaded, nauseous.  He then developed some abdominal pain.  He describes the pain as sharp in nature and states that it was mostly in the epigastric, periumbilical and lower abdominal.  He states now his abdominal pain has improved.  He did not take any medications.  He also had nausea and 3 episodes of nonbloody, nonbilious vomiting last night and this morning.  He also had several episodes of diarrhea.  No blood noted in stools.  He reports that when he went to work, they checked his temperature and it was measured at 100.2, prompting ED visit.  He states his abdominal pain has improved.  He still feels slightly lightheaded and nauseous.  He does report that when he was driving in his car here he was panicked and anxious and had some shortness of breath but states that has resolved as well.  He denies any chest pain, difficulty breathing, dysuria, hematuria, cough.  The history is provided by the patient.       History reviewed. No pertinent past medical history.  There are no problems to display for this patient.   History reviewed. No pertinent surgical history.     No family history on file.  Social History   Tobacco Use   Smoking status: Current Every Day Smoker    Packs/day: 0.50    Types: Cigarettes   Smokeless tobacco: Never Used  Substance Use Topics   Alcohol use: Never   Drug use: Never    Home Medications Prior to Admission medications     Medication Sig Start Date End Date Taking? Authorizing Provider  Aspirin-Caffeine 845-65 MG PACK Take by mouth.    [provider]  dicyclomine (BENTYL) 20 MG tablet Take 1 tablet (20 mg total) by mouth 2 (two) times daily. 06/18/19   Maxwell Caul, PA-C  naproxen (NAPROSYN) 500 MG tablet Take 1 tablet (500 mg total) by mouth 2 (two) times daily. 06/07/18   Horton, Mayer Masker, MD  ondansetron (ZOFRAN ODT) 4 MG disintegrating tablet Take 1 tablet (4 mg total) by mouth every 8 (eight) hours as needed for nausea or vomiting. 06/18/19   Maxwell Caul, PA-C    Allergies    Patient has no known allergies.  Review of Systems   Review of Systems  Constitutional: Positive for fever.  Respiratory: Negative for cough and shortness of breath.   Cardiovascular: Negative for chest pain.  Gastrointestinal: Positive for abdominal pain, diarrhea and nausea. Negative for vomiting.  Genitourinary: Negative for dysuria and hematuria.  Neurological: Positive for light-headedness. Negative for headaches.  All other systems reviewed and are negative.   Physical Exam Updated Vital Signs BP 113/79 (BP Location: Left Arm)    Pulse 71    Temp 98.6 F (37 C) (Oral)    Resp 18    Ht 5\' 9"  (1.753 m)    Wt 112.5 kg    SpO2 98%    BMI 36.62 kg/m  Physical Exam Vitals and nursing note reviewed.  Constitutional:      Appearance: Normal appearance. He is well-developed.     Comments: Sitting comfortably on examination table  HENT:     Head: Normocephalic and atraumatic.  Eyes:     General: Lids are normal.     Conjunctiva/sclera: Conjunctivae normal.     Pupils: Pupils are equal, round, and reactive to light.  Cardiovascular:     Rate and Rhythm: Normal rate and regular rhythm.     Pulses: Normal pulses.     Heart sounds: Normal heart sounds. No murmur. No friction rub. No gallop.   Pulmonary:     Effort: Pulmonary effort is normal.     Breath sounds: Normal breath sounds.     Comments:  Lungs clear to auscultation bilaterally.  Symmetric chest rise.  No wheezing, rales, rhonchi. Abdominal:     Palpations: Abdomen is soft. Abdomen is not rigid.     Tenderness: There is abdominal tenderness in the right lower quadrant, epigastric area, periumbilical area, suprapubic area and left lower quadrant. There is no right CVA tenderness, left CVA tenderness or guarding.     Comments: Abdomen soft, nondistended.  Tenderness palpation noted epigastric, periumbilical and lower quadrant diffusely.  No focal McBurney's point tenderness.  No CVA tenderness noted bilaterally.  Musculoskeletal:        General: Normal range of motion.     Cervical back: Full passive range of motion without pain.  Skin:    General: Skin is warm and dry.     Capillary Refill: Capillary refill takes less than 2 seconds.  Neurological:     Mental Status: He is alert and oriented to person, place, and time.     Comments: Cranial nerves III-XII intact Follows commands, Moves all extremities  5/5 strength to BUE and BLE  Sensation intact throughout all major nerve distributions No slurred speech. No facial droop.   Psychiatric:        Speech: Speech normal.     ED Results / Procedures / Treatments   Labs (all labs ordered are listed, but only abnormal results are displayed) Labs Reviewed  CBC - Abnormal; Notable for the following components:      Result Value   Platelets 145 (*)    All other components within normal limits  URINALYSIS, ROUTINE W REFLEX MICROSCOPIC - Abnormal; Notable for the following components:   Hgb urine dipstick TRACE (*)    All other components within normal limits  URINALYSIS, MICROSCOPIC (REFLEX) - Abnormal; Notable for the following components:   Bacteria, UA FEW (*)    All other components within normal limits  LIPASE, BLOOD  COMPREHENSIVE METABOLIC PANEL    EKG None  Radiology No results found.  Procedures Procedures (including critical care time)  Medications  Ordered in ED Medications  ondansetron (ZOFRAN-ODT) disintegrating tablet 4 mg (4 mg Oral Given 06/18/19 1249)  acetaminophen (TYLENOL) tablet 650 mg (650 mg Oral Given 06/18/19 1249)  sodium chloride 0.9 % bolus 1,000 mL (0 mLs Intravenous Stopped 06/18/19 1539)  ondansetron (ZOFRAN) injection 4 mg (4 mg Intravenous Given 06/18/19 1512)  ketorolac (TORADOL) 30 MG/ML injection 30 mg (30 mg Intravenous Given 06/18/19 1523)    ED Course  I have reviewed the triage vital signs and the nursing notes.  Pertinent labs & imaging results that were available during my care of the patient were reviewed by me and considered in my medical decision making (see chart for details).  MDM Rules/Calculators/A&P                      34 year old male who presents for evaluation of lightheadedness, nausea/vomiting/diarrhea and abdominal pain that began last night.  Reports getting his second Covid vaccine yesterday.  When he went home last night, he states that he felt lightheaded and nauseous.  This morning at work, he had a fever of 100.2.  He had episodes of vomiting/diarrhea today.  Reports abdominal pain is improved.  On initial arrival, he is afebrile, nontoxic-appearing.  Vital signs are stable.  On exam, he has diffuse abdominal tenderness to the epigastric, periumbilical and lower abdominal region.  No rigidity, guarding.  He does have some tenderness into the lower abdomen but no focal point.  He has no rigidity, guarding.  Lungs clear to auscultation.  I suspect this is most likely response/reaction from Covid vaccine.  History/physical exam not concerning for diverticulitis.  Doubt appendicitis given acuity of condition as well as physical exam. History/physical exam is not concerning for kidney stone. Labs ordered at triage.  Plan for IV fluids, nausea medication.  CBC shows no leukocytosis or anemia.  Platelets are 145.  Lipase is normal.  CMP shows normal BUN and creatinine.  Urine shows trace hemoglobin.  No  infectious etiology.  Reevaluation.  Patient is resting comfortably in bed without any difficulty.  Repeat vitals show no temperature.  He has been able to tolerate p.o. without any difficulty.  Repeat abdominal exam still he still has diffuse tenderness over the epigastric, periumbilical and lower abdomen diffusely.  At this time, I suspect his symptoms are likely related to reaction after the second Covid vaccine.  His history/physical exam is not concerning for appendicitis as he has diffuse abdominal pain, no leukocytosis and has had vomiting and diarrhea.  Additionally, he has no focal point tenderness noted at McBurney point.  I discussed with him regarding his work-up here in the ED.  We discussed at length regarding further work-up here in the emergency department.  We engaged in shared decision making regarding evaluation with CT abdomen pelvis.  After extensive discussion, patient agreed regarding no imaging at this time.  Given that I do not have concern for surgical abdomen, do not feel that CT abdomen pelvis is warranted at this time.  Patient in agreement.  Patient is ready to go home.  We will plan to send him home with some Zofran and Bentyl for symptomatic relief.  Encouraged at home supportive care measures. At this time, patient exhibits no emergent life-threatening condition that require further evaluation in ED or admission. Patient had ample opportunity for questions and discussion. All patient's questions were answered with full understanding. Strict return precautions discussed. Patient expresses understanding and agreement to plan.   Portions of this note were generated with Scientist, clinical (histocompatibility and immunogenetics). Dictation errors may occur despite best attempts at proofreading.  Final Clinical Impression(s) / ED Diagnoses Final diagnoses:  Generalized abdominal pain  Nausea vomiting and diarrhea  Adverse effect of vaccine, initial encounter    Rx / DC Orders ED Discharge Orders          Ordered    ondansetron (ZOFRAN ODT) 4 MG disintegrating tablet  Every 8 hours PRN     06/18/19 1609    dicyclomine (BENTYL) 20 MG tablet  2 times daily     06/18/19 1609           Maxwell Caul, PA-C 06/18/19 1642  Lucrezia Starch, MD 06/19/19 [phone]

## 2019-06-18 NOTE — Discharge Instructions (Signed)
Make sure you are staying hydrated drinking plenty of fluids.  Take Zofran for nausea.  Take Bentyl for abdominal cramping or pain.  Return to the Emergency Department immediately if you experience any worsening abdominal pain, fever, persistent nausea and vomiting, inability keep any food down, pain with urination, blood in your urine or any other worsening or concerning symptoms.

## 2019-06-18 NOTE — ED Triage Notes (Signed)
Received 2nd Moderna vaccine yesterday. States he "doesn't feel right". C/o abd pain and fever.

## 2020-10-26 ENCOUNTER — Emergency Department (HOSPITAL_BASED_OUTPATIENT_CLINIC_OR_DEPARTMENT_OTHER)
Admission: EM | Admit: 2020-10-26 | Discharge: 2020-10-27 | Disposition: A | Discharge status: Home / Self Care | Payer: Self-pay | Attending: Emergency Medicine | Admitting: Emergency Medicine

## 2020-10-26 ENCOUNTER — Encounter (HOSPITAL_BASED_OUTPATIENT_CLINIC_OR_DEPARTMENT_OTHER): Payer: Self-pay | Admitting: *Deleted

## 2020-10-26 ENCOUNTER — Other Ambulatory Visit: Payer: Self-pay

## 2020-10-26 DIAGNOSIS — Z20822 Contact with and (suspected) exposure to covid-19: Secondary | ICD-10-CM | POA: Insufficient documentation

## 2020-10-26 DIAGNOSIS — J111 Influenza due to unidentified influenza virus with other respiratory manifestations: Secondary | ICD-10-CM | POA: Insufficient documentation

## 2020-10-26 DIAGNOSIS — F17210 Nicotine dependence, cigarettes, uncomplicated: Secondary | ICD-10-CM | POA: Insufficient documentation

## 2020-10-26 DIAGNOSIS — Z7982 Long term (current) use of aspirin: Secondary | ICD-10-CM | POA: Insufficient documentation

## 2020-10-26 DIAGNOSIS — R0602 Shortness of breath: Secondary | ICD-10-CM | POA: Insufficient documentation

## 2020-10-26 MED ORDER — IBUPROFEN 400 MG PO TABS
400.0000 mg | ORAL_TABLET | Freq: Once | ORAL | Status: AC
  Administered 2020-10-26: 400 mg via ORAL
  Filled 2020-10-26: qty 1

## 2020-10-26 MED ORDER — ALBUTEROL SULFATE HFA 108 (90 BASE) MCG/ACT IN AERS
2.0000 | INHALATION_SPRAY | RESPIRATORY_TRACT | Status: DC | PRN
  Administered 2020-10-26: 2 via RESPIRATORY_TRACT
  Filled 2020-10-26: qty 6.7

## 2020-10-26 NOTE — ED Triage Notes (Signed)
Fever and cough since yesterday.  

## 2020-10-26 NOTE — ED Provider Notes (Signed)
[facility] Provider Note: Lowella Dell, MD, FACEP  CSN: [id number] MRN: [id number] ARRIVAL: 10/26/20 at 2248 ROOM: MH12/MH12   CHIEF COMPLAINT  Cough and Fever   HISTORY OF PRESENT ILLNESS  10/26/20 11:26 PM Jesse Dixon is a 35 y.o. male with flulike symptoms since yesterday.  Specifically he has had fever to 103.2, he has had body aches which he rates as a 6 out of 10.  He has had a scratchy throat but no nasal congestion.  He has had a cough and some mild shortness of breath.  The cough is nonproductive.  He has taken NyQuil without adequate relief.  He describes his body aches as his   History reviewed. No pertinent past medical history.  History reviewed. No pertinent surgical history.  No family history on file.  Social History   Tobacco Use   Smoking status: Every Day    Packs/day: 0.50    Types: Cigarettes   Smokeless tobacco: Never  Substance Use Topics   Alcohol use: Never   Drug use: Never    Prior to Admission medications   Medication Sig Start Date End Date Taking? Authorizing Provider  ibuprofen (ADVIL) 800 MG tablet Take 1 tablet 3 times daily as needed for body aches or fever. 10/27/20  Yes Alta Goding, MD  Aspirin-Caffeine [phone] MG PACK Take by mouth.    [provider]    Allergies Patient has no known allergies.   REVIEW OF SYSTEMS  Negative except as noted here or in the History of Present Illness.   PHYSICAL EXAMINATION  Initial Vital Signs Blood pressure (!) 142/93, pulse 94, temperature 98.9 F (37.2 C), temperature source Oral, resp. rate 16, height 5\' 9"  (1.753 m), weight 109.8 kg, SpO2 94 %.  Examination General: Well-developed, well-nourished male in no acute distress; appearance consistent with age of record HENT: normocephalic; atraumatic; no pharyngeal erythema or exudate Eyes: pupils equal, round and reactive to light; extraocular muscles intact Neck: supple Heart: regular rate and rhythm Lungs:  Mildly decreased air movement bilaterally; frequent cough Abdomen: soft; nondistended; nontender; bowel sounds present Extremities: No deformity; full range of motion; pulses normal Neurologic: Awake, alert and oriented; motor function intact in all extremities and symmetric; no facial droop Skin: Warm and dry Psychiatric: Flat affect   RESULTS  Summary of this visit's results, reviewed and interpreted by myself:   EKG Interpretation  Date/Time:    Ventricular Rate:    PR Interval:    QRS Duration:   QT Interval:    QTC Calculation:   R Axis:     Text Interpretation:         Laboratory Studies: Results for orders placed or performed during the hospital encounter of 10/26/20 (from the past 24 hour(s))  Resp Panel by RT-PCR (Flu A&B, Covid) Nasopharyngeal Swab     Status: None   Collection Time: 10/26/20 11:32 PM   Specimen: Nasopharyngeal Swab; Nasopharyngeal(NP) swabs in vial transport medium  Result Value Ref Range   SARS Coronavirus 2 by RT PCR NEGATIVE NEGATIVE   Influenza A by PCR NEGATIVE NEGATIVE   Influenza B by PCR NEGATIVE NEGATIVE   Imaging Studies: No results found.  ED COURSE and MDM  Nursing notes, initial and subsequent vitals signs, including pulse oximetry, reviewed and interpreted by myself.  Vitals:   10/26/20 2307 10/26/20 2308 10/26/20 2343  BP:  (!) 142/93   Pulse:  94   Resp:  16   Temp:  98.9 F (37.2 C)  TempSrc:  Oral   SpO2:  94% 96%  Weight: 109.8 kg    Height: 5\' 9"  (1.753 m)     Medications  albuterol (VENTOLIN HFA) 108 (90 Base) MCG/ACT inhaler 2 puff (2 puffs Inhalation Given 10/26/20 2341)  ibuprofen (ADVIL) tablet 400 mg (400 mg Oral Given 10/26/20 2353)   12:35 AM Patient's breathing improved after albuterol inhaler.  Patient given inhaler and AeroChamber and instructed in their use.  Patient negative for COVID and influenza.  Presentation is consistent with a viral respiratory illness.   PROCEDURES  Procedures   ED  DIAGNOSES     ICD-10-CM   1. Influenza-like illness  J11.1          Shantae Vantol, MD 10/27/20 [phone]

## 2020-10-27 LAB — RESP PANEL BY RT-PCR (FLU A&B, COVID) ARPGX2
Influenza A by PCR: NEGATIVE
Influenza B by PCR: NEGATIVE
SARS Coronavirus 2 by RT PCR: NEGATIVE

## 2020-10-27 MED ORDER — IBUPROFEN 800 MG PO TABS
ORAL_TABLET | ORAL | 0 refills | Status: DC
Start: 2020-10-27 — End: 2021-07-13

## 2021-07-12 ENCOUNTER — Other Ambulatory Visit: Payer: Self-pay

## 2021-07-12 ENCOUNTER — Emergency Department (HOSPITAL_BASED_OUTPATIENT_CLINIC_OR_DEPARTMENT_OTHER)
Admission: EM | Admit: 2021-07-12 | Discharge: 2021-07-13 | Disposition: A | Discharge status: Home / Self Care | Payer: Self-pay | Attending: Emergency Medicine | Admitting: Emergency Medicine

## 2021-07-12 ENCOUNTER — Encounter (HOSPITAL_BASED_OUTPATIENT_CLINIC_OR_DEPARTMENT_OTHER): Payer: Self-pay

## 2021-07-12 ENCOUNTER — Emergency Department (HOSPITAL_BASED_OUTPATIENT_CLINIC_OR_DEPARTMENT_OTHER): Payer: Self-pay

## 2021-07-12 DIAGNOSIS — R630 Anorexia: Secondary | ICD-10-CM | POA: Insufficient documentation

## 2021-07-12 DIAGNOSIS — R110 Nausea: Secondary | ICD-10-CM | POA: Insufficient documentation

## 2021-07-12 DIAGNOSIS — R1032 Left lower quadrant pain: Secondary | ICD-10-CM | POA: Insufficient documentation

## 2021-07-12 DIAGNOSIS — R1030 Lower abdominal pain, unspecified: Secondary | ICD-10-CM

## 2021-07-12 DIAGNOSIS — F17210 Nicotine dependence, cigarettes, uncomplicated: Secondary | ICD-10-CM | POA: Insufficient documentation

## 2021-07-12 DIAGNOSIS — R1031 Right lower quadrant pain: Secondary | ICD-10-CM | POA: Insufficient documentation

## 2021-07-12 LAB — URINALYSIS, MICROSCOPIC (REFLEX): WBC, UA: NONE SEEN WBC/hpf (ref 0–5)

## 2021-07-12 LAB — COMPREHENSIVE METABOLIC PANEL
ALT: 26 U/L (ref 0–44)
AST: 21 U/L (ref 15–41)
Albumin: 4.1 g/dL (ref 3.5–5.0)
Alkaline Phosphatase: 56 U/L (ref 38–126)
Anion gap: 5 (ref 5–15)
BUN: 10 mg/dL (ref 6–20)
CO2: 25 mmol/L (ref 22–32)
Calcium: 9 mg/dL (ref 8.9–10.3)
Chloride: 109 mmol/L (ref 98–111)
Creatinine, Ser: 1.14 mg/dL (ref 0.61–1.24)
GFR, Estimated: >60 mL/min (ref >60)
Glucose, Bld: 101 mg/dL — ABNORMAL HIGH (ref 70–99)
Potassium: 3.7 mmol/L (ref 3.5–5.1)
Sodium: 139 mmol/L (ref 135–145)
Total Bilirubin: 0.4 mg/dL (ref 0.3–1.2)
Total Protein: 6.7 g/dL (ref 6.5–8.1)

## 2021-07-12 LAB — URINALYSIS, ROUTINE W REFLEX MICROSCOPIC
Bilirubin Urine: NEGATIVE
Glucose, UA: NEGATIVE mg/dL
Ketones, ur: NEGATIVE mg/dL
Leukocytes,Ua: NEGATIVE
Nitrite: NEGATIVE
Protein, ur: NEGATIVE mg/dL
Specific Gravity, Urine: 1.025 (ref 1.005–1.030)
pH: 7 (ref 5.0–8.0)

## 2021-07-12 LAB — CBC
HCT: 42.1 % (ref 39.0–52.0)
Hemoglobin: 14.6 g/dL (ref 13.0–17.0)
MCH: 31.9 pg (ref 26.0–34.0)
MCHC: 34.7 g/dL (ref 30.0–36.0)
MCV: 92.1 fL (ref 80.0–100.0)
Platelets: 162 10*3/uL (ref 150–400)
RBC: 4.57 MIL/uL (ref 4.22–5.81)
RDW: 14.3 % (ref 11.5–15.5)
WBC: 9.3 10*3/uL (ref 4.0–10.5)
nRBC: 0 % (ref 0.0–0.2)

## 2021-07-12 LAB — LIPASE, BLOOD: Lipase: 25 U/L (ref 11–51)

## 2021-07-12 MED ORDER — ONDANSETRON HCL 4 MG/2ML IJ SOLN
4.0000 mg | Freq: Once | INTRAMUSCULAR | Status: AC
Start: 2021-07-12 — End: 2021-07-12
  Administered 2021-07-12: 4 mg via INTRAVENOUS
  Filled 2021-07-12: qty 2

## 2021-07-12 MED ORDER — FENTANYL CITRATE PF 50 MCG/ML IJ SOSY
100.0000 ug | PREFILLED_SYRINGE | Freq: Once | INTRAMUSCULAR | Status: AC
  Administered 2021-07-12: 100 ug via INTRAVENOUS
  Filled 2021-07-12: qty 2

## 2021-07-12 MED ORDER — IOHEXOL 300 MG/ML  SOLN
100.0000 mL | Freq: Once | INTRAMUSCULAR | Status: AC | PRN
  Administered 2021-07-12: 100 mL via INTRAVENOUS

## 2021-07-12 NOTE — ED Provider Notes (Signed)
[facility] Provider Note: Lowella Dell, MD, FACEP  CSN: [id number] MRN: [id number] ARRIVAL: 07/12/21 at 1907 ROOM: MH12/MH12   CHIEF COMPLAINT  Flank Pain   HISTORY OF PRESENT ILLNESS  07/12/21 10:54 PM Jesse Dixon is a 36 y.o. male with about 24 hours of left lower quadrant and right lower quadrant abdominal pain.  He describes this as a tightness or fullness.  It is not severe when he is lying still but is aggravated when he is up moving around.  He rates the pain as an 8 out of 10.  He has had nausea with this but no vomiting or diarrhea.  He is not aware of having a fever.  He has had no urinary symptoms.  He has had a loss of appetite.   History reviewed. No pertinent past medical history.  No past surgical history on file.  No family history on file.  Social History   Tobacco Use   Smoking status: Every Day    Packs/day: 0.50    Types: Cigarettes   Smokeless tobacco: Never  Substance Use Topics   Alcohol use: Never   Drug use: Never    Prior to Admission medications   Medication Sig Start Date End Date Taking? Authorizing Provider  dicyclomine (BENTYL) 20 MG tablet Take 1 tablet (20 mg total) by mouth every 6 (six) hours as needed (Abdominal cramping). 07/13/21  Yes Omaree Fuqua, MD  ondansetron (ZOFRAN) 8 MG tablet Take 1 tablet (8 mg total) by mouth every 8 (eight) hours as needed for nausea. 07/13/21  Yes Staci Carver, MD    Allergies Patient has no known allergies.   REVIEW OF SYSTEMS  Negative except as noted here or in the History of Present Illness.   PHYSICAL EXAMINATION  Initial Vital Signs Blood pressure 135/89, pulse 76, temperature 98.3 F (36.8 C), temperature source Oral, resp. rate 18, SpO2 98 %.  Examination General: Well-developed, well-nourished male in no acute distress; appearance consistent with age of record HENT: normocephalic; atraumatic Eyes: Normal appearance Neck: supple Heart: regular rate and rhythm Lungs:  clear to auscultation bilaterally Abdomen: soft; nondistended; right lower quadrant and left lower quadrant tenderness; bowel sounds present Extremities: No deformity; full range of motion; pulses normal Neurologic: Awake, alert and oriented; motor function intact in all extremities and symmetric; no facial droop Skin: Warm and dry Psychiatric: Normal mood and affect   RESULTS  Summary of this visit's results, reviewed and interpreted by myself:   EKG Interpretation  Date/Time:    Ventricular Rate:    PR Interval:    QRS Duration:   QT Interval:    QTC Calculation:   R Axis:     Text Interpretation:         Laboratory Studies: Results for orders placed or performed during the hospital encounter of 07/12/21 (from the past 24 hour(s))  Urinalysis, Routine w reflex microscopic Urine, Clean Catch     Status: Abnormal   Collection Time: 07/12/21  7:23 PM  Result Value Ref Range   Color, Urine YELLOW YELLOW   APPearance CLEAR CLEAR   Specific Gravity, Urine 1.025 1.005 - 1.030   pH 7.0 5.0 - 8.0   Glucose, UA NEGATIVE NEGATIVE mg/dL   Hgb urine dipstick TRACE (A) NEGATIVE   Bilirubin Urine NEGATIVE NEGATIVE   Ketones, ur NEGATIVE NEGATIVE mg/dL   Protein, ur NEGATIVE NEGATIVE mg/dL   Nitrite NEGATIVE NEGATIVE   Leukocytes,Ua NEGATIVE NEGATIVE  Urinalysis, Microscopic (reflex)     Status:  Abnormal   Collection Time: 07/12/21  7:23 PM  Result Value Ref Range   RBC / HPF 0-5 0 - 5 RBC/hpf   WBC, UA NONE SEEN 0 - 5 WBC/hpf   Bacteria, UA RARE (A) NONE SEEN   Squamous Epithelial / LPF 0-5 0 - 5  Lipase, blood     Status: None   Collection Time: 07/12/21  7:25 PM  Result Value Ref Range   Lipase 25 11 - 51 U/L  Comprehensive metabolic panel     Status: Abnormal   Collection Time: 07/12/21  7:25 PM  Result Value Ref Range   Sodium 139 135 - 145 mmol/L   Potassium 3.7 3.5 - 5.1 mmol/L   Chloride 109 98 - 111 mmol/L   CO2 25 22 - 32 mmol/L   Glucose, Bld 101 (H) 70 - 99  mg/dL   BUN 10 6 - 20 mg/dL   Creatinine, Ser 1.61 0.61 - 1.24 mg/dL   Calcium 9.0 8.9 - 09.6 mg/dL   Total Protein 6.7 6.5 - 8.1 g/dL   Albumin 4.1 3.5 - 5.0 g/dL   AST 21 15 - 41 U/L   ALT 26 0 - 44 U/L   Alkaline Phosphatase 56 38 - 126 U/L   Total Bilirubin 0.4 0.3 - 1.2 mg/dL   GFR, Estimated >04 >54 mL/min   Anion gap 5 5 - 15  CBC     Status: None   Collection Time: 07/12/21  7:25 PM  Result Value Ref Range   WBC 9.3 4.0 - 10.5 K/uL   RBC 4.57 4.22 - 5.81 MIL/uL   Hemoglobin 14.6 13.0 - 17.0 g/dL   HCT 09.8 11.9 - 14.7 %   MCV 92.1 80.0 - 100.0 fL   MCH 31.9 26.0 - 34.0 pg   MCHC 34.7 30.0 - 36.0 g/dL   RDW 82.9 56.2 - 13.0 %   Platelets 162 150 - 400 K/uL   nRBC 0.0 0.0 - 0.2 %   Imaging Studies: CT ABDOMEN PELVIS W CONTRAST  Result Date: 07/12/2021 CLINICAL DATA:  Bilateral flank pain. EXAM: CT ABDOMEN AND PELVIS WITH CONTRAST TECHNIQUE: Multidetector CT imaging of the abdomen and pelvis was performed using the standard protocol following bolus administration of intravenous contrast. RADIATION DOSE REDUCTION: This exam was performed according to the departmental dose-optimization program which includes automated exposure control, adjustment of the mA and/or kV according to patient size and/or use of iterative reconstruction technique. CONTRAST:  OMNIPAQUE IOHEXOL 300 MG/ML  SOLN COMPARISON:  July 06, 2020 FINDINGS: Lower chest: No acute abnormality. Hepatobiliary: No focal liver abnormality is seen. No gallstones, gallbladder wall thickening, or biliary dilatation. Pancreas: Unremarkable. No pancreatic ductal dilatation or surrounding inflammatory changes. Spleen: Normal in size without focal abnormality. Adrenals/Urinary Tract: Adrenal glands are unremarkable. Kidneys are normal in size, without obstructing renal calculi or hydronephrosis. A stable 2.2 cm diameter simple cyst is seen within the lateral aspect of the mid to upper left kidney. No additional follow-up or  imaging is recommended. Bladder is unremarkable. Stomach/Bowel: Stomach is within normal limits. Appendix appears normal. No evidence of bowel wall thickening, distention, or inflammatory changes. Vascular/Lymphatic: No significant vascular findings are present. No enlarged abdominal or pelvic lymph nodes. Reproductive: Prostate is unremarkable. Other: No abdominal wall hernia or abnormality. No abdominopelvic ascites. Musculoskeletal: No acute or significant osseous findings. IMPRESSION: 1. No acute or active process within the abdomen or pelvis. Electronically Signed   By: Aram Candela M.D.   On: 07/12/2021  23:48    ED COURSE and MDM  Nursing notes, initial and subsequent vitals signs, including pulse oximetry, reviewed and interpreted by myself.  Vitals:   07/12/21 1918 07/12/21 2230 07/12/21 2300  BP: (!) 160/96 135/89 (!) 152/106  Pulse: 96 76 74  Resp: 17 18 17   Temp: 98.3 F (36.8 C)    TempSrc: Oral    SpO2: 98% 98% 99%   Medications  ondansetron (ZOFRAN) injection 4 mg (4 mg Intravenous Given 07/12/21 2311)  fentaNYL (SUBLIMAZE) injection 100 mcg (100 mcg Intravenous Given 07/12/21 2313)  iohexol (OMNIPAQUE) 300 MG/ML solution 100 mL (100 mLs Intravenous Contrast Given 07/12/21 2330)    11:02 PM The patient's laboratory studies are within normal limits.  This could be an atypical presentation of appendicitis, diverticulitis or colitis.  We will proceed with a CT scan to further evaluate his abdomen.  There is no blood in his urine to suggest a kidney stone.  12:00 AM Patient advised of reassuring lab work and CT scan.  The cause of his pain is unclear.  We will trial oral Bentyl for symptomatic relief.  He was advised to return if symptoms change or worsen.  PROCEDURES  Procedures   ED DIAGNOSES     ICD-10-CM   1. Lower abdominal pain  R10.30          Kennia Vanvorst, MD 07/13/21 0002

## 2021-07-12 NOTE — ED Triage Notes (Signed)
Patient presents to ED via POV from work. Reports bilateral flank pain that is radiating around to abdomen. Endorses nausea. Denies vomiting.

## 2021-07-13 MED ORDER — ONDANSETRON HCL 8 MG PO TABS: 8.0000 mg | ORAL_TABLET | Freq: Three times a day (TID) | ORAL | 0 refills | Status: DC | PRN

## 2021-07-13 MED ORDER — DICYCLOMINE HCL 20 MG PO TABS: 20.0000 mg | ORAL_TABLET | Freq: Four times a day (QID) | ORAL | 0 refills | Status: AC | PRN

## 2021-07-13 NOTE — ED Notes (Signed)
Pt reports his brother will be driving him home. Pt A&OX4 ambulatory at d/c with independent steady gait. Pt verbalized understanding of d/c instructions, prescriptions and follow up care.

## 2021-07-13 NOTE — ED Notes (Signed)
Pt reports he is trying to call someone for a ride home.

## 2022-05-16 ENCOUNTER — Other Ambulatory Visit: Payer: Self-pay

## 2022-05-16 ENCOUNTER — Emergency Department (HOSPITAL_BASED_OUTPATIENT_CLINIC_OR_DEPARTMENT_OTHER)
Admission: EM | Admit: 2022-05-16 | Discharge: 2022-05-16 | Disposition: A | Discharge status: Home / Self Care | Payer: Self-pay | Attending: Emergency Medicine | Admitting: Emergency Medicine

## 2022-05-16 ENCOUNTER — Encounter (HOSPITAL_BASED_OUTPATIENT_CLINIC_OR_DEPARTMENT_OTHER): Payer: Self-pay | Admitting: Urology

## 2022-05-16 DIAGNOSIS — Z1152 Encounter for screening for COVID-19: Secondary | ICD-10-CM | POA: Insufficient documentation

## 2022-05-16 DIAGNOSIS — K529 Noninfective gastroenteritis and colitis, unspecified: Secondary | ICD-10-CM | POA: Insufficient documentation

## 2022-05-16 LAB — URINALYSIS, ROUTINE W REFLEX MICROSCOPIC
Bilirubin Urine: NEGATIVE
Glucose, UA: NEGATIVE mg/dL
Ketones, ur: NEGATIVE mg/dL
Leukocytes,Ua: NEGATIVE
Nitrite: NEGATIVE
Protein, ur: NEGATIVE mg/dL
Specific Gravity, Urine: 1.025 (ref 1.005–1.030)
pH: 6 (ref 5.0–8.0)

## 2022-05-16 LAB — URINALYSIS, MICROSCOPIC (REFLEX)

## 2022-05-16 LAB — COMPREHENSIVE METABOLIC PANEL
ALT: 32 U/L (ref 0–44)
AST: 30 U/L (ref 15–41)
Albumin: 4.5 g/dL (ref 3.5–5.0)
Alkaline Phosphatase: 66 U/L (ref 38–126)
Anion gap: 9 (ref 5–15)
BUN: 12 mg/dL (ref 6–20)
CO2: 23 mmol/L (ref 22–32)
Calcium: 9.2 mg/dL (ref 8.9–10.3)
Chloride: 105 mmol/L (ref 98–111)
Creatinine, Ser: 1.13 mg/dL (ref 0.61–1.24)
GFR, Estimated: >60 mL/min (ref >60)
Glucose, Bld: 109 mg/dL — ABNORMAL HIGH (ref 70–99)
Potassium: 4.3 mmol/L (ref 3.5–5.1)
Sodium: 137 mmol/L (ref 135–145)
Total Bilirubin: 0.7 mg/dL (ref 0.3–1.2)
Total Protein: 8.1 g/dL (ref 6.5–8.1)

## 2022-05-16 LAB — LIPASE, BLOOD: Lipase: 25 U/L (ref 11–51)

## 2022-05-16 LAB — SARS CORONAVIRUS 2 BY RT PCR: SARS Coronavirus 2 by RT PCR: NEGATIVE

## 2022-05-16 MED ORDER — ONDANSETRON HCL 4 MG PO TABS: 4.0000 mg | ORAL_TABLET | Freq: Four times a day (QID) | ORAL | 0 refills | Status: DC

## 2022-05-16 MED ORDER — LOPERAMIDE HCL 2 MG PO CAPS
4.0000 mg | ORAL_CAPSULE | Freq: Once | ORAL | Status: AC
  Administered 2022-05-16: 4 mg via ORAL
  Filled 2022-05-16: qty 2

## 2022-05-16 MED ORDER — SODIUM CHLORIDE 0.9 % IV BOLUS
1000.0000 mL | Freq: Once | INTRAVENOUS | Status: AC
  Administered 2022-05-16: 1000 mL via INTRAVENOUS

## 2022-05-16 MED ORDER — ONDANSETRON HCL 4 MG/2ML IJ SOLN
4.0000 mg | Freq: Once | INTRAMUSCULAR | Status: AC | PRN
  Administered 2022-05-16: 4 mg via INTRAVENOUS
  Filled 2022-05-16: qty 2

## 2022-05-16 NOTE — ED Notes (Signed)

## 2022-05-16 NOTE — Discharge Instructions (Addendum)
Please follow-up with your primary care for of your choosing the 1 I have attached your for you for your recent symptoms and ER visit.  Today your labs are reassuring and you most likely have a viral gastroenteritis causing your symptoms.  Please check the MyChart app for your COVID results and pick up the Zofran I prescribed for you for your nausea.  You may get Imodium over-the-counter for the diarrhea.  You may alternate between Tylenol and ibuprofen every 6 hours needed for pain.  Please remain hydrated and eat food as tolerated.  If symptoms worsen please return to ER.

## 2022-05-16 NOTE — ED Provider Notes (Signed)
[facility] Provider Note   CSN: [id number] Arrival date & time: 05/16/22  1353     History  Chief Complaint  Patient presents with   Emesis    Jesse Dixon is a 37 y.o. male presenting with nausea vomiting diarrhea that began this morning at 6 AM.  Patient states that his daughter is sick at home with the same symptoms.  Patient has had multiple episodes of nonbloody emesis and diarrhea since this morning and has not been able to tolerate food or fluids.  Patient states he has a headache that is bandlike around his head since this morning as well but denies any vision changes, fevers, recent illnesses, neck pain.  Patient not been able to take any Tylenol or ibuprofen as he is puking anything orally.   Patient denied any chest pain, shortness of breath, changes sensation/motor skills, new medications, allergies, fever/chills, constipation  Home Medications Prior to Admission medications   Medication Sig Start Date End Date Taking? Authorizing Provider  ondansetron (ZOFRAN) 4 MG tablet Take 1 tablet (4 mg total) by mouth every 6 (six) hours. 05/16/22  Yes Staisha Winiarski, Beverly Gust, PA-C  dicyclomine (BENTYL) 20 MG tablet Take 1 tablet (20 mg total) by mouth every 6 (six) hours as needed (Abdominal cramping). 07/13/21   Molpus, Jonny Ruiz, MD      Allergies    Patient has no known allergies.    Review of Systems   Review of Systems  Gastrointestinal:  Positive for vomiting.    Physical Exam Updated Vital Signs BP (!) 149/105 (BP Location: Left Arm)   Pulse (!) 104   Temp 98.3 F (36.8 C) (Oral)   Resp 20   Ht 5\' 9"  (1.753 m)   Wt 117.9 kg   SpO2 92%   BMI 38.40 kg/m  Physical Exam Vitals reviewed.  Constitutional:      General: He is not in acute distress. HENT:     Head: Normocephalic and atraumatic.  Eyes:     Extraocular Movements: Extraocular movements intact.     Conjunctiva/sclera: Conjunctivae normal.     Pupils: Pupils are  equal, round, and reactive to light.  Cardiovascular:     Rate and Rhythm: Regular rhythm. Tachycardia present.     Pulses: Normal pulses.     Heart sounds: Normal heart sounds.     Comments: 2+ bilateral radial/dorsalis pedis pulses with regular rate Pulmonary:     Effort: Pulmonary effort is normal. No respiratory distress.     Breath sounds: Normal breath sounds.  Abdominal:     Palpations: Abdomen is soft.     Tenderness: There is abdominal tenderness (generalized). There is no guarding or rebound.  Musculoskeletal:        General: Normal range of motion.     Cervical back: Normal range of motion and neck supple.     Comments: 5 out of 5 bilateral grip/leg extension strength  Skin:    General: Skin is warm and dry.     Capillary Refill: Capillary refill takes less than 2 seconds.  Neurological:     General: No focal deficit present.     Mental Status: He is alert and oriented to person, place, and time.     Comments: Sensation intact in all 4 limbs  Psychiatric:        Mood and Affect: Mood normal.     ED Results / Procedures / Treatments   Labs (all labs ordered are listed, but only  abnormal results are displayed) Labs Reviewed  COMPREHENSIVE METABOLIC PANEL - Abnormal; Notable for the following components:      Result Value   Glucose, Bld 109 (*)    All other components within normal limits  CBC - Abnormal; Notable for the following components:   WBC 11.8 (*)    All other components within normal limits  URINALYSIS, ROUTINE W REFLEX MICROSCOPIC - Abnormal; Notable for the following components:   Hgb urine dipstick TRACE (*)    All other components within normal limits  URINALYSIS, MICROSCOPIC (REFLEX) - Abnormal; Notable for the following components:   Bacteria, UA RARE (*)    All other components within normal limits  SARS CORONAVIRUS 2 BY RT PCR  LIPASE, BLOOD    EKG EKG Interpretation  Date/Time:  Friday May 16 2022 14:44:10 EDT Ventricular Rate:  86 PR  Interval:  151 QRS Duration: 89 QT Interval:  343 QTC Calculation: 411 R Axis:   -9 Text Interpretation: Sinus rhythm Confirmed by Lockie Mola, Adam ([id number]) on 05/16/2022 3:08:37 PM  Radiology No results found.  Procedures Procedures    Medications Ordered in ED Medications  ondansetron (ZOFRAN) injection 4 mg (4 mg Intravenous Given 05/16/22 1407)  sodium chloride 0.9 % bolus 1,000 mL (1,000 mLs Intravenous New Bag/Given 05/16/22 1422)  loperamide (IMODIUM) capsule 4 mg (4 mg Oral Given 05/16/22 1458)    ED Course/ Medical Decision Making/ A&P                             Medical Decision Making  Jesse Dixon 37 y.o. presented today for nausea vomiting diarrhea. Working DDx that I considered at this time includes, but not limited to, gastroenteritis, colitis, small bowel obstruction, appendicitis, cholecystitis, pancreatitis, nephrolithiasis, AAA, UTI, pyleonephritis, testicular torsion.  R/o DDx: colitis, small bowel obstruction, appendicitis, cholecystitis, pancreatitis, nephrolithiasis, AAA, UTI, pyleonephritis, testicular torsion: These are considered less likely due to history of present illness and physical exam findings.  Review of prior external notes: 12/09/2021 ED  Unique Tests and My Interpretation:  CBC with differential: unremarkable CMP: unremarkable Lipase: unremarkable UA: unremarkable COVID: pending EKG: Rate, rhythm, axis, intervals all examined and without medically relevant abnormality. ST segments without concerns for elevations  Discussion with Independent Historian: None  Discussion of Management of Tests: None  Risk: Medium: prescription drug management  Risk Stratification Score: none   Plan: Patient presented for nausea vomiting diarrhea. On exam patient was no acute distress however did was slightly tachycardic 104.  Patient had no abdominal tenderness without peritoneal signs.  The rest of patient's physical exam was unremarkable.  Patient will be  given fluids along with Zofran and Imodium for symptom management and reevaluated.  I suspect this is viral gastroenteritis as patient has daughter at home sick with the same symptoms and due to timing of symptoms.  Patient stable at this time.  On reevaluation after single liter of fluids, Zofran, Imodium patient stated he felt immensely better and his vitals had stabilized.  Patient's heart rate in the chart currently is 104 however in the room it was 82.  I spoke to the patient will get a COVID swab and discharging him and having him follow-up with his COVID results on MyChart and patient verbalized agreement to this plan.  Spoke to the patient about how he has a viral illness and that we do not need to do further imaging and patient verbalized agreement.  Patient be discharged on Zofran  and educated that he may take Imodium over-the-counter and alternate every 6 hours needed for pain to Tylenol and ibuprofen.  Patient given a work note.  Patient was given return precautions.patient stable for discharge at this time.  Patient verbalized understanding of plan.         Final Clinical Impression(s) / ED Diagnoses Final diagnoses:  Gastroenteritis    Rx / DC Orders ED Discharge Orders          Ordered    ondansetron (ZOFRAN) 4 MG tablet  Every 6 hours        05/16/22 1520              Netta Corrigan, PA-C 05/16/22 1524    Virgina Norfolk, DO 05/19/22 0701

## 2022-05-16 NOTE — ED Triage Notes (Signed)
Pt states N/V/D since 0600 this am  Generalized abdominal pain  Unable to tolerate po fluids at this time

## 2022-05-18 LAB — CBC
HCT: 47 % (ref 39.0–52.0)
Hemoglobin: 15.8 g/dL (ref 13.0–17.0)
MCH: 30.7 pg (ref 26.0–34.0)
MCHC: 33.6 g/dL (ref 30.0–36.0)
MCV: 91.3 fL (ref 80.0–100.0)
Platelets: 165 10*3/uL (ref 150–400)
RBC: 5.15 MIL/uL (ref 4.22–5.81)
RDW: 14.3 % (ref 11.5–15.5)
WBC: 11.8 10*3/uL — ABNORMAL HIGH (ref 4.0–10.5)
nRBC: 0 % (ref 0.0–0.2)

## 2022-11-16 NOTE — ED Provider Notes (Addendum)
 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  Shared service with APP.  Patient was appropriately risk stratified for observation care due to the chief complaint of right leg injury.    On exam today, patient is weight, alert, oriented Heart:  Regular rate and rhythm with no murmurs, rubs, gallops Lungs: Clear to auscultation bilaterally Abd: Soft, nontender, nondistended Extremity: Knee immobilizer in place with the leg wrapped.  Pain with any movement.  Intact pulses distally  Patient is doing well this morning.  He complains of pain whenever he moves his right leg.  CT scan of the leg showed hematoma in the muscle body but no obvious bony injuries.  Given the nature of the mechanism of injury the patient likely tore the muscle in his leg.  CK was elevated but improved overnight.  We will have the patient seen by orthopedics this morning for any recommendations but likely he will be discharged later this morning.  I performed a substantive portion of the history, physical exam, and medical decision making for the patient encounter, as documented by the APP.    The patient is currently in observation status in order to determine necessity of potential hospital admission.  The patient presentation is consistent with acute presentation with potential threat to life or bodily function ,due to right leg injury.    Plan is for patient education, reassessment, orthopedic consult.  Dorn Arloa Hoover

## 2023-05-29 NOTE — ED Provider Notes (Signed)
 Patient placed in First Look pathway, seen and evaluated for chief complaint of right foot injury.  Pertinent exam findings include no obvious distress.   Based on initial evaluation, labs are not currently indicated and radiology studies are currently indicated as allowed for current processes and treatments as applicable in a triage setting. Patient counseled on process, plan, and necessity for staying for completing the evaluation.    Note By: Toribio Clam, PA-C 6:01 PM   Emergency Department Provider Note  This document was created using the aid of voice recognition Dragon dictation software.  History   Chief Complaint  Patient presents with  . Foot Injury     HPI  Jesse Dixon is a 38 y.o. male who presents to the ED with complaints of injury to right foot after dropping a table on it today.   Past Medical History   No past medical history on file.  Physical Exam   Vitals:   05/29/23 1755  BP: (!) 195/91  BP Location: Right arm  Patient Position: Standing  Pulse: (!) 113  Resp: 16  Temp: 98 F (36.7 C)  TempSrc: Oral  SpO2: 95%  Weight: 120 kg (265 lb)  Height: 175.3 cm (5' 9)    Physical Exam Constitutional:      Appearance: Normal appearance. He is normal weight.  HENT:     Head: Normocephalic and atraumatic.     Nose: Nose normal.  Eyes:     Extraocular Movements: Extraocular movements intact.  Cardiovascular:     Rate and Rhythm: Normal rate.  Pulmonary:     Effort: Pulmonary effort is normal.     Breath sounds: Normal breath sounds.  Abdominal:     Palpations: Abdomen is soft.  Musculoskeletal:        General: Normal range of motion.     Cervical back: Normal range of motion.     Comments: Ambulatory w/ antalgic steady gate. Pain w/ palpation of the dorsum of the foot. DP 2+ b/l  Skin:    General: Skin is warm and dry.  Neurological:     General: No focal deficit present.     Mental Status: He is alert and oriented to person, place, and  time.     Procedure Note  Procedures   ED Clinical Impression   1. Contusion of right foot, initial encounter    ED Assessment   ED Course as of 05/29/23 1934  Fri May 29, 2023  1932 11/24/2022 telehealth hospitalist at home note reviewed  [DD]  1934 XR Foot Minimum 3 Views Right No acute fracture or dislocation  [DD]    ED Course User Index [DD] Toribio Kava Day, PA-C    On my initial exam, the pt was alert and oriented x 3, resting comfortably in bed, in no acute distress.   Upon reassessing patient, patient was stable. Based on the above findings, I believe patient is hemodynamically stable for discharge.  Clinical Complexity  Patient's presentation is most consistent with acute presentation with potential threat to life or bodily function.  Decision to admit or increased level of care requiring transfer: No: Evaluation and diagnostic workup in ED does not suggest an emergent condition requiring admission or immediate observation beyond what has been performed at this time. The patient is safe for discharge and has been instructed to return for worsening symptoms, change in symptoms, or any other concerns.  n/a  OTC medications advised and discussed with patient include Motrin  and Tylenol .  Prescription  medications discussed: none  Discussed options for workup with patient including risks and benefits via shared decision making and decided to proceed with workup.  Evidence based calculators if applicable:      Medical Decision Making   Differentials considered: DDX: strain, sprain, fracture, contusion, dislocation, hematoma, bursitis   Clinical Complexity Number and complexity of problems addressed: Medical Decision Making Patient presented for right foot injury after dropping a table on it.  Vital signs hemodynamically stable.  Physical exam as above.  X-rays negative for acute fracture or dislocation.  This appears to be soft tissue injury.  Patient stable  for discharge  Problems Addressed: Contusion of right foot, initial encounter: complicated acute illness or injury  Amount and/or Complexity of Data Reviewed External Data Reviewed: notes.    Details: Documented in ED course Radiology: ordered.    Patient/and family educated about specific return precautions for given chief complaint and symptoms.  Patient/and family educated about follow-up with PCP.  Patient/and family expressed understanding of return precautions and need for follow-up.  Patient discharged.   I considered ordering CT R Foot, however, after further ED work up and history I felt this was unnecessary.  All radiology studies reviewed independently, additionally reading provided by radiologist available, unless otherwise noted.   Emergency Department Medication Summary: Medications - No data to display  ED Disposition: ED Disposition     ED Disposition  Discharge   Condition  Stable   Comment  --

## 2023-06-04 ENCOUNTER — Emergency Department (HOSPITAL_BASED_OUTPATIENT_CLINIC_OR_DEPARTMENT_OTHER): Payer: Self-pay

## 2023-06-04 ENCOUNTER — Encounter (HOSPITAL_BASED_OUTPATIENT_CLINIC_OR_DEPARTMENT_OTHER): Payer: Self-pay | Admitting: Emergency Medicine

## 2023-06-04 ENCOUNTER — Other Ambulatory Visit: Payer: Self-pay

## 2023-06-04 ENCOUNTER — Emergency Department (HOSPITAL_BASED_OUTPATIENT_CLINIC_OR_DEPARTMENT_OTHER)
Admission: EM | Admit: 2023-06-04 | Discharge: 2023-06-04 | Disposition: A | Discharge status: Home / Self Care | Payer: Self-pay | Attending: Emergency Medicine | Admitting: Emergency Medicine

## 2023-06-04 ENCOUNTER — Other Ambulatory Visit (HOSPITAL_BASED_OUTPATIENT_CLINIC_OR_DEPARTMENT_OTHER): Payer: Self-pay

## 2023-06-04 DIAGNOSIS — J189 Pneumonia, unspecified organism: Secondary | ICD-10-CM | POA: Insufficient documentation

## 2023-06-04 LAB — RESP PANEL BY RT-PCR (RSV, FLU A&B, COVID)  RVPGX2
Influenza A by PCR: NEGATIVE
Influenza B by PCR: NEGATIVE
Resp Syncytial Virus by PCR: NEGATIVE
SARS Coronavirus 2 by RT PCR: NEGATIVE

## 2023-06-04 MED ORDER — ONDANSETRON HCL 4 MG PO TABS
4.0000 mg | ORAL_TABLET | Freq: Four times a day (QID) | ORAL | 0 refills | Status: AC
  Filled 2023-06-04: qty 12, 3d supply, fill #0

## 2023-06-04 MED ORDER — DOXYCYCLINE HYCLATE 100 MG PO TABS
100.0000 mg | ORAL_TABLET | Freq: Once | ORAL | Status: AC
  Administered 2023-06-04: 100 mg via ORAL
  Filled 2023-06-04: qty 1

## 2023-06-04 MED ORDER — ACETAMINOPHEN 500 MG PO TABS
1000.0000 mg | ORAL_TABLET | Freq: Once | ORAL | Status: AC
  Administered 2023-06-04: 1000 mg via ORAL
  Filled 2023-06-04: qty 2

## 2023-06-04 MED ORDER — IBUPROFEN 400 MG PO TABS
600.0000 mg | ORAL_TABLET | Freq: Once | ORAL | Status: AC
  Administered 2023-06-04: 600 mg via ORAL
  Filled 2023-06-04: qty 1

## 2023-06-04 MED ORDER — DOXYCYCLINE HYCLATE 100 MG PO CAPS
100.0000 mg | ORAL_CAPSULE | Freq: Two times a day (BID) | ORAL | 0 refills | Status: AC
Start: 2023-06-04 — End: 2023-06-09
  Filled 2023-06-04: qty 10, 5d supply, fill #0

## 2023-06-04 NOTE — ED Triage Notes (Signed)
 Fever, chills, nausea, body aches, headache and occasional cough for 4 days.  Pt took ibuprofen  yesterday.

## 2023-06-04 NOTE — ED Notes (Signed)
 Pt alert and oriented X 4 at the time of discharge. RR even and unlabored. No acute distress noted. Pt verbalized understanding of discharge instructions as discussed. Pt ambulatory to lobby at time of discharge.

## 2023-06-04 NOTE — Discharge Instructions (Addendum)
 Please follow up with your primary care provider in regards to recent ER visit and symptoms.  We believe that your symptoms are caused today by pneumonia, an infection in your lung(s).  Fortunately you should start to improve quickly after taking your antibiotics.  Please take the full course of antibiotics as prescribed and drink plenty of fluids. You may use Tylenol  every 6 hours as needed for pain.  Follow up with your doctor within 1-2 days.  If you develop any new or worsening symptoms, including but not limited to fever in spite of taking over-the-counter ibuprofen  and/or Tylenol , persistent vomiting, worsening shortness of breath, or other symptoms that concern you, please return to the Emergency Department immediately.

## 2023-06-04 NOTE — ED Provider Notes (Signed)
 [place] [facility] Provider Note   CSN: [id number] Arrival date & time: 06/04/23  1301     History  Chief Complaint  Patient presents with   URI    Jesse Dixon is a 38 y.o. male no pertinent past medical history presented with fevers chills nausea with bodyaches and occasional nonproductive cough for the past 4 days.  Patient states he has felt warm at home and has not taken any medications.  Patient is eating and drinking okay and denies any emesis.  Patient states he just feels generally weak and denies any sick contacts or ear pain.  Patient Nuys any neck pain.  Patient denies chest pain shortness of breath.    Home Medications Prior to Admission medications   Medication Sig Start Date End Date Taking? Authorizing Provider  doxycycline (VIBRAMYCIN) 100 MG capsule Take 1 capsule (100 mg total) by mouth 2 (two) times daily for 5 days. 06/04/23 06/09/23 Yes Runette Scifres, Arlin Benes, PA-C  ondansetron  (ZOFRAN ) 4 MG tablet Take 1 tablet (4 mg total) by mouth every 6 (six) hours. 06/04/23  Yes Keagan Brislin, Arlin Benes, PA-C  dicyclomine  (BENTYL ) 20 MG tablet Take 1 tablet (20 mg total) by mouth every 6 (six) hours as needed (Abdominal cramping). 07/13/21   Molpus, Autry Legions, MD      Allergies    Patient has no known allergies.    Review of Systems   Review of Systems  Physical Exam Updated Vital Signs BP 128/83   Pulse 84   Temp 98.3 F (36.8 C) (Oral)   Resp 18   Ht 5\' 9"  (1.753 m)   Wt 118 kg   SpO2 97%   BMI 38.42 kg/m  Physical Exam HENT:     Right Ear: Tympanic membrane, ear canal and external ear normal.     Left Ear: Tympanic membrane, ear canal and external ear normal.     Nose: Congestion and rhinorrhea present.     Mouth/Throat:     Mouth: Mucous membranes are moist.     Pharynx: Posterior oropharyngeal erythema present. No oropharyngeal exudate.  Eyes:     Extraocular Movements: Extraocular movements intact.     Conjunctiva/sclera:  Conjunctivae normal.     Pupils: Pupils are equal, round, and reactive to light.  Cardiovascular:     Rate and Rhythm: Normal rate and regular rhythm.     Pulses: Normal pulses.     Heart sounds: Normal heart sounds.  Pulmonary:     Effort: Pulmonary effort is normal. No respiratory distress.     Breath sounds: Normal breath sounds.  Abdominal:     General: Abdomen is flat.     Palpations: Abdomen is soft.     Tenderness: There is no abdominal tenderness. There is no guarding.  Musculoskeletal:        General: Normal range of motion.     Cervical back: Normal range of motion.  Lymphadenopathy:     Cervical: No cervical adenopathy.  Skin:    General: Skin is warm.     Capillary Refill: Capillary refill takes less than 2 seconds.  Neurological:     General: No focal deficit present.     Mental Status: He is alert and oriented to person, place, and time.  Psychiatric:        Mood and Affect: Mood normal.     ED Results / Procedures / Treatments   Labs (all labs ordered are listed, but only abnormal results are displayed) Labs  Reviewed  RESP PANEL BY RT-PCR (RSV, FLU A&B, COVID)  RVPGX2    EKG None  Radiology DG Chest Port 1 View Result Date: 06/04/2023 CLINICAL DATA:  Cough with fever and chills. EXAM: PORTABLE CHEST - 1 VIEW COMPARISON:  None available. FINDINGS: No cardiomegaly. Patchy retrocardiac airspace opacities. No pleural effusion or pneumothorax. No acute fracture or destructive lesion. IMPRESSION: Patchy airspace opacities in the retrocardiac left lung base, which may represent atelectasis or changes of a developing bronchopneumonia. A 2 view chest radiograph in 6-12 weeks is recommended to document resolution once treatment is complete. Electronically Signed   By: Rance Burrows M.D.   On: 06/04/2023 15:52    Procedures Procedures    Medications Ordered in ED Medications  acetaminophen  (TYLENOL ) tablet 1,000 mg (1,000 mg Oral Given 06/04/23 1318)  ibuprofen   (ADVIL ) tablet 600 mg (600 mg Oral Given 06/04/23 1318)  doxycycline (VIBRA-TABS) tablet 100 mg (100 mg Oral Given 06/04/23 1604)    ED Course/ Medical Decision Making/ A&P                                 Medical Decision Making Amount and/or Complexity of Data Reviewed Radiology: ordered.  Risk OTC drugs.   Jesse Dixon 38 y.o. presented today for URI like symptoms. Working DDx that I considered at this time includes, but not limited to, viral illness, pharyngitis, mono, sinusitis, electrolyte abnormality, AOM, pneumonia.  R/o DDx: pharyngitis, mono, sinusitis, electrolyte abnormality, AOM, pneumonia: these diagnoses are not consistent with patient's history, presentation, physical exam, labs/imaging findings.  Review of prior external notes: 05/29/2023 ED  Unique Tests and My Independent Interpretation:  Respiratory Panel: Negative Chest x-ray: Possible bronchopneumonia  Social Determinants of Health: none  Discussion with Independent Historian: None  Discussion of Management of Tests: None  Risk: Medium: prescription drug management  Risk Stratification Score: Centor score 1  Plan: On exam patient was no acute distress with stable vitals.  Patient was given ibuprofen  and Tylenol  in triage and feels better after these and his vitals have improved.  Will p.o. challenge.  Will get chest x-ray as patient is having nonproductive cough however respiratory panel is negative.  If negative do suspect patient's viral illness.  Patient was p.o. challenged and is safe to be discharged at this time follow-up outpatient.  X-ray shows possible opacity for pneumonia and so we will give first dose of Doxy here and prescribe the rest.  Patient is not requiring any oxygen  and has not required any oxygen  at any point during the ED stay.  I recommended patient use Tylenol  every 6 hours needed for pain remain hydrated.  Will prescribe Zofran  in case patient becomes nauseous.  Recommend patient  follows up with primary care provider.  At this time patient is safe to be discharged.  Patient was given return precautions. Patient stable for discharge at this time.  Patient verbalized understanding of plan.  This chart was dictated using voice recognition software.  Despite best efforts to proofread,  errors can occur which can change the documentation meaning.        Final Clinical Impression(s) / ED Diagnoses Final diagnoses:  Pneumonia due to infectious organism, unspecified laterality, unspecified part of lung    Rx / DC Orders ED Discharge Orders          Ordered    ondansetron  (ZOFRAN ) 4 MG tablet  Every 6 hours  06/04/23 1518    doxycycline (VIBRAMYCIN) 100 MG capsule  2 times daily        06/04/23 1558              Elex Grimmer 06/04/23 1611    Jerilynn Montenegro, MD 06/04/23 [phone]

## 2023-06-15 ENCOUNTER — Other Ambulatory Visit (HOSPITAL_BASED_OUTPATIENT_CLINIC_OR_DEPARTMENT_OTHER): Payer: Self-pay

## 2023-10-09 ENCOUNTER — Emergency Department (HOSPITAL_BASED_OUTPATIENT_CLINIC_OR_DEPARTMENT_OTHER)
Admission: EM | Admit: 2023-10-09 | Discharge: 2023-10-09 | Disposition: A | Discharge status: Home / Self Care | Payer: Self-pay | Attending: Emergency Medicine | Admitting: Emergency Medicine

## 2023-10-09 ENCOUNTER — Emergency Department (HOSPITAL_BASED_OUTPATIENT_CLINIC_OR_DEPARTMENT_OTHER): Payer: Self-pay

## 2023-10-09 ENCOUNTER — Other Ambulatory Visit: Payer: Self-pay

## 2023-10-09 ENCOUNTER — Encounter (HOSPITAL_BASED_OUTPATIENT_CLINIC_OR_DEPARTMENT_OTHER): Payer: Self-pay | Admitting: Emergency Medicine

## 2023-10-09 DIAGNOSIS — J069 Acute upper respiratory infection, unspecified: Secondary | ICD-10-CM | POA: Insufficient documentation

## 2023-10-09 DIAGNOSIS — R911 Solitary pulmonary nodule: Secondary | ICD-10-CM | POA: Insufficient documentation

## 2023-10-09 LAB — COMPREHENSIVE METABOLIC PANEL WITH GFR
ALT: 42 U/L (ref 0–44)
AST: 37 U/L (ref 15–41)
Albumin: 4.5 g/dL (ref 3.5–5.0)
Alkaline Phosphatase: 59 U/L (ref 38–126)
Anion gap: 10 (ref 5–15)
BUN: 10 mg/dL (ref 6–20)
CO2: 23 mmol/L (ref 22–32)
Calcium: 9.2 mg/dL (ref 8.9–10.3)
Chloride: 107 mmol/L (ref 98–111)
Creatinine, Ser: 0.93 mg/dL (ref 0.61–1.24)
GFR, Estimated: >60 mL/min
Glucose, Bld: 84 mg/dL (ref 70–99)
Potassium: 4.6 mmol/L (ref 3.5–5.1)
Sodium: 140 mmol/L (ref 135–145)
Total Bilirubin: 0.5 mg/dL (ref 0.0–1.2)
Total Protein: 7.3 g/dL (ref 6.5–8.1)

## 2023-10-09 LAB — RESP PANEL BY RT-PCR (RSV, FLU A&B, COVID)  RVPGX2
Influenza A by PCR: NEGATIVE
Influenza B by PCR: NEGATIVE
Resp Syncytial Virus by PCR: NEGATIVE
SARS Coronavirus 2 by RT PCR: NEGATIVE

## 2023-10-09 LAB — GROUP A STREP BY PCR: Group A Strep by PCR: NOT DETECTED

## 2023-10-09 MED ORDER — BENZONATATE 100 MG PO CAPS: 100.0000 mg | ORAL_CAPSULE | Freq: Three times a day (TID) | ORAL | 0 refills | Status: AC

## 2023-10-09 MED ORDER — LORATADINE 10 MG PO TABS
10.0000 mg | ORAL_TABLET | Freq: Every day | ORAL | 0 refills | Status: AC
Start: 2023-10-09 — End: ?

## 2023-10-09 MED ORDER — IOHEXOL 300 MG/ML  SOLN
100.0000 mL | Freq: Once | INTRAMUSCULAR | Status: AC | PRN
  Administered 2023-10-09: 100 mL via INTRAVENOUS

## 2023-10-09 NOTE — Discharge Instructions (Addendum)
 Please follow-up with family doctor for pulmonary nodule, you can have repeat CT scan in 1 month to watch this area.  Take loratadine  which will help with nasal congestion and ear pain.  Take Tessalon  Perles which will help with cough.  I also recommend cough drops, warm tea with honey and lemon.  You can take Tylenol  and naproxen  for fever. Return to ED with new or worsening symptoms.

## 2023-10-09 NOTE — ED Provider Notes (Addendum)
 [place] [facility] Provider Note   CSN: [id number] Arrival date & time: 10/09/23  1113     Patient presents with: URI   Jesse Dixon is a 39 y.o. male with noncontributory past medical history reporting to ER with complaint of fever, sore throat, ear pain, and loss of taste and smell. He is also having popping sensation in ears and cough. Cough is dry. He reports possible sick contact at work.  No shortness of breath now, no chest pain, no fever here.     URI Presenting symptoms: congestion        Prior to Admission medications   Medication Sig Start Date End Date Taking? Authorizing Provider  dicyclomine  (BENTYL ) 20 MG tablet Take 1 tablet (20 mg total) by mouth every 6 (six) hours as needed (Abdominal cramping). 07/13/21   Molpus, John, MD  ondansetron  (ZOFRAN ) 4 MG tablet Take 1 tablet (4 mg total) by mouth every 6 (six) hours. 06/04/23   Victor Lynwood DASEN, PA-C    Allergies: Patient has no known allergies.    Review of Systems  HENT:  Positive for congestion.     Updated Dixon Signs BP (!) 136/97 (BP Location: Right Arm)   Pulse 73   Temp 98.3 F (36.8 C) (Oral)   Resp 16   Ht 5' 9 (1.753 m)   SpO2 98%   BMI 38.42 kg/m   Physical Exam Vitals and nursing note reviewed.  Constitutional:      General: He is not in acute distress.    Appearance: He is not toxic-appearing.  HENT:     Head: Normocephalic and atraumatic.     Right Ear: Tympanic membrane, ear canal and external ear normal.     Left Ear: Tympanic membrane, ear canal and external ear normal.     Ears:     Comments: No evidence of acute otitis media.    Mouth/Throat:     Comments: Posterior pharynx is erythematous but without exudates.  No uvula swelling.  No sign of peritonsillar abscess.  Patient handling secretions with normal phonation. Eyes:     General: No scleral icterus.    Conjunctiva/sclera: Conjunctivae normal.  Cardiovascular:     Rate and  Rhythm: Normal rate and regular rhythm.     Pulses: Normal pulses.     Heart sounds: Normal heart sounds.  Pulmonary:     Effort: Pulmonary effort is normal. No respiratory distress.     Breath sounds: Normal breath sounds.     Comments: Lungs clear to auscultation.  Not hypoxic.  No acute distress. Abdominal:     General: Abdomen is flat. Bowel sounds are normal.     Palpations: Abdomen is soft.     Tenderness: There is no abdominal tenderness.  Musculoskeletal:     Right lower leg: No edema.     Left lower leg: No edema.  Skin:    General: Skin is warm and dry.     Findings: No lesion.  Neurological:     General: No focal deficit present.     Mental Status: He is alert and oriented to person, place, and time. Mental status is at baseline.     (all labs ordered are listed, but only abnormal results are displayed) Labs Reviewed  GROUP A STREP BY PCR  RESP PANEL BY RT-PCR (RSV, FLU A&B, COVID)  RVPGX2  CBC WITH DIFFERENTIAL/PLATELET  COMPREHENSIVE METABOLIC PANEL WITH GFR    EKG: None  Radiology: CT Chest W  Contrast Result Date: 10/09/2023 CLINICAL DATA:  Respiratory illness, nondiagnostic xray. EXAM: CT CHEST WITH CONTRAST TECHNIQUE: Multidetector CT imaging of the chest was performed during intravenous contrast administration. RADIATION DOSE REDUCTION: This exam was performed according to the departmental dose-optimization program which includes automated exposure control, adjustment of the mA and/or kV according to patient size and/or use of iterative reconstruction technique. CONTRAST:  OMNIPAQUE  IOHEXOL  300 MG/ML  SOLN COMPARISON:  None Available. FINDINGS: Cardiovascular: Normal cardiac size. No pericardial effusion. No aortic aneurysm. Mediastinum/Nodes: Visualized thyroid gland appears grossly unremarkable. No solid / cystic mediastinal masses. The esophagus is nondistended precluding optimal assessment. No axillary, mediastinal or hilar lymphadenopathy by size  criteria. Lungs/Pleura: The central tracheo-bronchial tree is patent. Linear area of atelectasis/scarring noted in the right lung lower lobe. No mass or consolidation. No pleural effusion or pneumothorax. There are 2, sub 4 mm, solid noncalcified nodules in the left lung with largest in the upper lobe measuring up 3.0 X 3.6 mm (series 3, images 35 and 72). Please see follow-up recommendations below. Upper Abdomen: There is a 2.7 x 2.8 cm cyst arising from the left kidney upper pole. There is a small sliding hiatal hernia. Remaining visualized upper abdominal viscera within normal limits. Musculoskeletal: The visualized soft tissues of the chest wall are grossly unremarkable. No suspicious osseous lesions. IMPRESSION: 1. No acute cardiopulmonary abnormality. 2. There are 2, sub 4 mm, solid noncalcified nodules in the left lung. Please see follow-up recommendations below. 3. Other nonacute observations, as described above. Pulmonary nodule follow-up recommendation: Solid lung nodules measuring up to 6 mm: - LOW RISK: No routine follow-up. - HIGHER RISK: Optional CT at 12 months. Recommendations for evaluation of incidental nodules derived from guidelines developed by the Fleischner Society ( Radiology 2017; [phone]). These guidelines apply to incidental nodules, which can be managed according to the specific recommendations. These guidelines do not apply to patient's younger than 35 years, immunocompromised patients, or patients with cancer. For lung cancer screening, adherence to the existing [employer] of radiology lung CT Screening Reporting and Data System (lung-RADS) guidelines is recommended. High risk factors include older age, heavy smoking, larger nodule size, irregular or spiculated margins and upper lobe location. Clinical risk factors include smoking, exposure to other carcinogens, emphysema, fibrosis, upper lobe location, family history of lung cancer, age and sex. Electronically Signed   By:  Ree Molt M.D.   On: 10/09/2023 14:22   DG Chest Portable 1 View Result Date: 10/09/2023 EXAM: 1 VIEW(S) XRAY OF THE CHEST 10/09/2023 12:23:00 PM COMPARISON: 06/04/2023 CLINICAL HISTORY: SOB, cough. Pt reports fever, no taste, dyspnea on exertion, no appetite x2 days. Also c/o ear fullness, sore throat for same time. Denies swelling in legs or abd. FINDINGS: LUNGS AND PLEURA: New bandlike opacity at right lung base. No pulmonary edema. No pleural effusion. No pneumothorax. HEART AND MEDIASTINUM: No acute abnormality of the cardiac and mediastinal silhouettes. BONES AND SOFT TISSUES: No acute osseous abnormality. IMPRESSION: 1. New bandlike opacity at the right lung base, compatible with subsegmental atelectasis or developing infection; correlate clinically and consider follow-up imaging. Electronically signed by: Waddell Calk MD 10/09/2023 12:58 PM EDT RP Workstation: [id number]     Procedures   Medications Ordered in the ED  iohexol  (OMNIPAQUE ) 300 MG/ML solution 100 mL (100 mLs Intravenous Contrast Given 10/09/23 1409)  Medical Decision Making Amount and/or Complexity of Data Reviewed Labs: ordered. Radiology: ordered.  Risk OTC drugs. Prescription drug management.   This patient presents to the ED for concern of flu like symptoms, this involves an extensive number of treatment options, and is a complaint that carries with it a high risk of complications and morbidity.  The differential diagnosis includes pneumonia, viral URI with cough, otitis media, otitis externa, strep throat, mono, other viral pharyngitis, peritonsillar abscess   Lab Tests:  I personally interpreted labs.  The pertinent results include:   Resp panel negative.  CBC, CMP unremarkable  Strep test negative.  PERC negative.    Imaging Studies ordered:  I ordered imaging studies including chest x-ray  I independently visualized and interpreted imaging which showed new  bandlike opacity of right lung base which would carry differential of atelectasis or infection.  Will obtain CT contrast study of chest. CT Chest w/ Contrast pulmonary nodule, no acute findings. I discussed this with patient.   Discussed pulmonary nodule with patient and we discussed appropriate follow up for this.  I agree with the radiologist interpretation   Problem List / ED Course / Critical interventions / Medication management  Patient is reporting with URI-like symptoms.  His respiratory panel is negative.  Given his erythematous throat and sore throat will obtain strep throat test here.  Given his cough and fever obtaining chest x-ray to rule out a pneumonia.  His vitals are stable and he is well-appearing.  His lungs are clear to auscultation.  Overall his physical exam was reassuring and lungs are clear to auscultation. Chest x-ray is showing atelectasis versus pneumonia will obtain chest CT with reported SOB on exertion and non diagnostic x-ray read.  Declined medications here.  I have reviewed the patients home medicines and have made adjustments as needed Patient hemodynamically stable and well-appearing throughout stay.  Feel appropriate for discharge with outpatient follow-up.  Discussed symptom management and return precautions.         Final diagnoses:  Viral URI with cough    ED Discharge Orders          Ordered    benzonatate  (TESSALON ) 100 MG capsule  Every 8 hours        10/09/23 1227    loratadine  (CLARITIN ) 10 MG tablet  Daily        10/09/23 1227               Zuri Bradway, Warren SAILOR, PA-C 10/09/23 1552    Shermon Warren SAILOR, PA-C 10/09/23 1553    Rogelia Jerilynn RAMAN, MD 10/14/23 [phone]

## 2023-10-09 NOTE — ED Triage Notes (Signed)
 Pt reports fever, no taste, dyspnea on exertion, no appetite x2 days. Also c/o ear fullness, sore throat for same time.   Denies swelling in legs or abd.

## 2023-10-10 LAB — CBC WITH DIFFERENTIAL/PLATELET
Abs Immature Granulocytes: 0.02 K/uL (ref 0.00–0.07)
Basophils Absolute: 0.1 K/uL (ref 0.0–0.1)
Basophils Relative: 1 %
Eosinophils Absolute: 0.3 K/uL (ref 0.0–0.5)
Eosinophils Relative: 3 %
HCT: 43.5 % (ref 39.0–52.0)
Hemoglobin: 14.5 g/dL (ref 13.0–17.0)
Immature Granulocytes: 0 %
Lymphocytes Relative: 23 %
Lymphs Abs: 1.9 K/uL (ref 0.7–4.0)
MCH: 30.7 pg (ref 26.0–34.0)
MCHC: 33.3 g/dL (ref 30.0–36.0)
MCV: 92.2 fL (ref 80.0–100.0)
Monocytes Absolute: 0.7 K/uL (ref 0.1–1.0)
Monocytes Relative: 8 %
Neutro Abs: 5.4 K/uL (ref 1.7–7.7)
Neutrophils Relative %: 65 %
Platelets: 153 K/uL (ref 150–400)
RBC: 4.72 MIL/uL (ref 4.22–5.81)
RDW: 15.3 % (ref 11.5–15.5)
Smear Review: NORMAL
WBC: 8.4 K/uL (ref 4.0–10.5)
nRBC: 0 % (ref 0.0–0.2)
